# Patient Record
Sex: Female | Born: 1960 | Race: White | Hispanic: No | Marital: Single | Smoking: Former smoker
Health system: Southern US, Community
[De-identification: ages and names within clinical notes are randomized; demographics above are authoritative.]

## PROBLEM LIST (undated history)

## (undated) DIAGNOSIS — F329 Major depressive disorder, single episode, unspecified: Secondary | ICD-10-CM

## (undated) DIAGNOSIS — F1027 Alcohol dependence with alcohol-induced persisting dementia: Secondary | ICD-10-CM

## (undated) DIAGNOSIS — K529 Noninfective gastroenteritis and colitis, unspecified: Secondary | ICD-10-CM

## (undated) DIAGNOSIS — F419 Anxiety disorder, unspecified: Secondary | ICD-10-CM

## (undated) DIAGNOSIS — F32A Depression, unspecified: Secondary | ICD-10-CM

## (undated) DIAGNOSIS — F1096 Alcohol use, unspecified with alcohol-induced persisting amnestic disorder: Secondary | ICD-10-CM

## (undated) DIAGNOSIS — E781 Pure hyperglyceridemia: Secondary | ICD-10-CM

## (undated) HISTORY — PX: BREAST ENHANCEMENT SURGERY: SHX7

## (undated) HISTORY — DX: Noninfective gastroenteritis and colitis, unspecified: K52.9

## (undated) HISTORY — DX: Anxiety disorder, unspecified: F41.9

## (undated) HISTORY — DX: Pure hyperglyceridemia: E78.1

## (undated) HISTORY — DX: Depression, unspecified: F32.A

## (undated) HISTORY — DX: Alcohol dependence with alcohol-induced persisting dementia: F10.27

## (undated) HISTORY — DX: Major depressive disorder, single episode, unspecified: F32.9

## (undated) HISTORY — DX: Alcohol use, unspecified with alcohol-induced persisting amnestic disorder: F10.96

---

## 1994-04-27 HISTORY — PX: BACK SURGERY: SHX140

## 1998-12-18 ENCOUNTER — Other Ambulatory Visit: Admission: RE | Admit: 1998-12-18 | Discharge: 1998-12-18 | Payer: Self-pay | Admitting: Obstetrics and Gynecology

## 2000-05-10 ENCOUNTER — Emergency Department (HOSPITAL_COMMUNITY): Admission: EM | Admit: 2000-05-10 | Discharge: 2000-05-11 | Payer: Self-pay | Admitting: Emergency Medicine

## 2000-05-11 ENCOUNTER — Encounter: Payer: Self-pay | Admitting: Emergency Medicine

## 2000-05-26 ENCOUNTER — Encounter: Payer: Self-pay | Admitting: Neurosurgery

## 2000-05-26 ENCOUNTER — Ambulatory Visit (HOSPITAL_COMMUNITY): Admission: RE | Admit: 2000-05-26 | Discharge: 2000-05-26 | Payer: Self-pay | Admitting: Neurosurgery

## 2000-07-22 ENCOUNTER — Encounter: Payer: Self-pay | Admitting: Neurosurgery

## 2000-07-22 ENCOUNTER — Ambulatory Visit (HOSPITAL_COMMUNITY): Admission: RE | Admit: 2000-07-22 | Discharge: 2000-07-22 | Payer: Self-pay | Admitting: Neurosurgery

## 2008-03-01 ENCOUNTER — Other Ambulatory Visit: Admission: RE | Admit: 2008-03-01 | Discharge: 2008-03-01 | Payer: Self-pay | Admitting: Internal Medicine

## 2013-02-13 ENCOUNTER — Other Ambulatory Visit: Payer: Self-pay

## 2013-02-13 DIAGNOSIS — Z9882 Breast implant status: Secondary | ICD-10-CM

## 2013-02-13 DIAGNOSIS — Z1231 Encounter for screening mammogram for malignant neoplasm of breast: Secondary | ICD-10-CM

## 2013-02-15 ENCOUNTER — Ambulatory Visit
Admission: RE | Admit: 2013-02-15 | Discharge: 2013-02-15 | Disposition: A | Discharge status: Home / Self Care | Payer: Self-pay | Source: Ambulatory Visit

## 2013-02-15 DIAGNOSIS — Z1231 Encounter for screening mammogram for malignant neoplasm of breast: Secondary | ICD-10-CM

## 2013-02-15 DIAGNOSIS — Z9882 Breast implant status: Secondary | ICD-10-CM

## 2013-02-20 ENCOUNTER — Other Ambulatory Visit: Payer: Self-pay | Admitting: Internal Medicine

## 2013-02-20 DIAGNOSIS — R928 Other abnormal and inconclusive findings on diagnostic imaging of breast: Secondary | ICD-10-CM

## 2013-03-09 ENCOUNTER — Ambulatory Visit
Admission: RE | Admit: 2013-03-09 | Discharge: 2013-03-09 | Disposition: A | Discharge status: Home / Self Care | Payer: BC Managed Care – PPO | Source: Ambulatory Visit | Attending: Internal Medicine | Admitting: Internal Medicine

## 2013-03-09 DIAGNOSIS — R928 Other abnormal and inconclusive findings on diagnostic imaging of breast: Secondary | ICD-10-CM

## 2014-07-25 ENCOUNTER — Encounter: Payer: Self-pay | Admitting: Neurology

## 2014-07-25 ENCOUNTER — Ambulatory Visit (INDEPENDENT_AMBULATORY_CARE_PROVIDER_SITE_OTHER): Payer: BLUE CROSS/BLUE SHIELD | Admitting: Neurology

## 2014-07-25 ENCOUNTER — Encounter: Payer: Self-pay | Admitting: *Deleted

## 2014-07-25 VITALS — BP 142/92 | HR 82 | Resp 14 | Ht 68.0 in | Wt 149.0 lb

## 2014-07-25 DIAGNOSIS — F1096 Alcohol use, unspecified with alcohol-induced persisting amnestic disorder: Secondary | ICD-10-CM

## 2014-07-25 DIAGNOSIS — F1027 Alcohol dependence with alcohol-induced persisting dementia: Secondary | ICD-10-CM | POA: Diagnosis not present

## 2014-07-25 DIAGNOSIS — R55 Syncope and collapse: Secondary | ICD-10-CM

## 2014-07-25 HISTORY — DX: Alcohol use, unspecified with alcohol-induced persisting amnestic disorder: F10.96

## 2014-07-25 HISTORY — DX: Alcohol dependence with alcohol-induced persisting dementia: F10.27

## 2014-07-25 MED ORDER — NAT-RUL PRENATAL VITAMINS 28-0.8 MG PO TABS: ORAL_TABLET | ORAL | Status: DC

## 2014-07-25 NOTE — Progress Notes (Signed)
Provider:  Melvyn Novas, M D  Referring Provider: Thayer Headings, MD Primary Care Physician:  Thayer Headings, MD  Chief Complaint  Patient presents with  . NP Sherry Scott  memory loss    Rm 10, daughter    HPI:  Sherry Scott is a 54 y.o. caucasian , rigt handed female,  seen here as a referral  from Dr. Thea Scott for memory loss,  Sherry Scott is referred today for evaluation of memory loss or dementia by Dr. Ferd Hibbs, her primary care physician the patient has been endorsing a past medical history of depression and anxiety and she had a back surgery in 1996, she is a current smoker endorsed some blurred vision memory loss and confusional spells as well as still some anxiety in her review of systems. She stated that the prescribed alprazolam is not what she likes to use- and that it doesn't make her feel very good. She is on an SSRI, which is citalopram 10 mg daily. The patient believes that she has more seasonal depression and that winter is a gloomy time for her. About one year ago, in March 2015 ,the patient first noticed memory difficulties. This alone also she is accompanied today by her daughter. Dr. Ronne Binning brought on 07-03-14 that the patient recently got lost in her own neighborhood, a fact she has already forgotten. She lives 25 years in the same area. She used to be a heavy drinker, still smokes. She forgot meals. She has good and bad days, she is remarkably unconcerned and her daughter had to call the maternal aunt to get someone to confront and change her mother. She has been sober for only 3 weeks.  She has difficulties for over 2-3 years according to the daughter.  Her mother lives with 4 dogs.   [name], Sherry Scott, came to assist in the task about 3 weeks ago. The patient had difficulties with paying the bills, and she had also not filed her taxes, not paid bills  and she had not renewed tags on her car, expired last January. The patient is at this time not  gainfully employed, she does keep her own house and she also long etc. she takes care of her 4 dogs, at this time unassisted. Her daughter feels the house is not kept clean.  The patient has all her daily routines cycle around the need of the animals. She has not worked out side the home. She retired from her own Furniture conservator/restorer business.   The patient has begun to take B complex vitamins. I reviewed today's Mini-Mental Status Examination with Sherry Scott, a 21 out of 30 points. The main point loss was in the 3 words of recall, but in the visual spatial orientation she was not able to draw clock face today. She was able to copy an image of 5 pentagons overlapping, and she generated 8 animal names. She mistook the months of the year for April, stated that it approached summer but could not find the word for spring, and was not sure about the date of course. Attention and calculation were attempted but could not be performed and she scored only 2 out of 5 points in spelling backwards.            Review of Systems: Out of a complete 14 system review, the patient complains of only the following symptoms, and all other reviewed systems are negative.  patient appears aloof.   History   Social History  . Marital Status:  Single    Spouse Name: N/A  . Number of Children: 1  . Years of Education: 2y coll   Occupational History  . retired    Social History Main Topics  . Smoking status: Current Every Day Smoker  . Smokeless tobacco: Not on file  . Alcohol Use: No     Comment: 3wks sober   . Drug Use: No  . Sexual Activity: Not on file   Other Topics Concern  . Not on file   Social History Narrative   Caffeine 5 cups coffee daily (weak).    Family History  Problem Relation Age of Onset  . Lung cancer Father   . Breast cancer Mother     Past Medical History  Diagnosis Date  . High triglycerides   . Anxiety   . Depression     Past Surgical History  Procedure Laterality Date   . Back surgery  1996    herniated disc Lower back    Current Outpatient Prescriptions  Medication Sig Dispense Refill  . citalopram (CELEXA) 10 MG tablet Take 10 mg by mouth daily.  1  . LORazepam (ATIVAN) 0.5 MG tablet Take 0.5 mg by mouth daily as needed for anxiety.    . Multiple Vitamin (MULTIVITAMIN) tablet Take 1 tablet by mouth daily.     No current facility-administered medications for this visit.    Allergies as of 07/25/2014  . (No Known Allergies)    Vitals: BP 142/92 mmHg  Pulse 82  Resp 14  Ht  (1.727 m)  Wt 149 lb (67.586 kg)  BMI 22.66 kg/m2 Last Weight:  Wt Readings from Last 1 Encounters:  07/25/14 149 lb (67.586 kg)   Last Height:   Ht Readings from Last 1 Encounters:  07/25/14  (1.727 m)    Physical exam:  General: The patient is awake, alert and appears not in acute distress. Head: Normocephalic, atraumatic. Neck is supple. Mallampati 2 , neck circumference: 14. Cardiovascular:  Regular rate and rhythm *, without  murmurs or carotid bruit, and without distended neck veins. Respiratory: Lungs are clear to auscultation. Skin:  Spider naevi, has reddened palms.  Trunk: BMI is normal posture.  Neurologic exam : The patient is awake and alert, oriented to place and time.  Memory subjective  described as impaired.  There is a normal attention span & concentration ability. Speech is non fluent without dysarthria, dysphonia but she has interrupted her sentences , has  aphasia. She also misunderstands, mistakes the console of the car for the space under th seat. Mood and affect are appropriate.  Cranial nerves: Pupils are equal and briskly reactive to light. Funduscopic exam without evidence of pallor or edema.  Extraocular movements  in vertical and horizontal planes intact and without nystagmus.  Visual fields by finger perimetry are intact. Hearing to finger rub intact.  Facial sensation intact to fine touch. Facial motor strength is  symmetric and tongue and uvula move midline. Tongue protrusion into either cheek is normal. Shoulder shrug is normal.   Motor exam:    Normal tone ,muscle bulk and symmetric  strength in all extremities.  Sensory:  Fine touch, pinprick and vibration were tested in all extremities. Proprioception was normal.  Coordination: Rapid alternating movements in the fingers/hands were normal. Finger-to-nose maneuver normal without evidence of ataxia, dysmetria or tremor.  Gait and station: Patient walks without assistive device and is able unassisted to climb up to the exam table. Strength within normal limits. Stance is stable  and normal. Tandem gait is unfragmented. Romberg testing is  negative   Deep tendon reflexes: in the  upper and lower extremities are brisk,  symmetric and intact. Babinski maneuver downgoing.   Assessment:  After physical and neurologic examination, review of laboratory studies, imaging, neurophysiology testing and pre-existing records,  45 minute assessment is that of : more than 30 minutes of face to face time was used to explain different forms of  dementia, the possible reversibility of dementia. The treatment options.  The patient consumed a 6 pack per day or more, denied heavy use of liquor or wine.      Patient with alcohol colleague, word finding difficulties, but no evidence of abnormal eye movements. At this time the patient has significant memory deficits but they could be considered reversible. I will obtain an MRI of the brain with and without contrast to look especially for a Wernicke's Korsakoff pathology.   The patient is already taking a B vitamin what I would like for her to do is to have a meal plan that she adheres to each day that should be high and proteins and vitamins, and also in electrolytes. Instead of the B complex I usually ask patients to take a prenatal vitamin since it also contains Foley acid and some iron 3 times a day. She may want to include  more food and solids in her daily diet.   she has been sober for 3 weeks- angry at her daughter and sister, easily agitated. Celexa should be 20 mg.   Plan:  Treatment plan and additional workup : Rv in 6 weeks with NP or me.  Continue  sessions with your therapist / substance abuse counselor-  Begun 3 weeks ago  with Sherry Scott , @ [facility].     Sherry Mylararmen Lory Nowaczyk MD 07/25/2014

## 2014-07-31 ENCOUNTER — Ambulatory Visit (INDEPENDENT_AMBULATORY_CARE_PROVIDER_SITE_OTHER): Payer: BLUE CROSS/BLUE SHIELD | Admitting: Neurology

## 2014-07-31 DIAGNOSIS — R55 Syncope and collapse: Secondary | ICD-10-CM | POA: Diagnosis not present

## 2014-07-31 DIAGNOSIS — F1027 Alcohol dependence with alcohol-induced persisting dementia: Secondary | ICD-10-CM

## 2014-07-31 DIAGNOSIS — F1096 Alcohol use, unspecified with alcohol-induced persisting amnestic disorder: Secondary | ICD-10-CM

## 2014-08-01 ENCOUNTER — Ambulatory Visit (INDEPENDENT_AMBULATORY_CARE_PROVIDER_SITE_OTHER): Payer: BLUE CROSS/BLUE SHIELD

## 2014-08-01 DIAGNOSIS — F1096 Alcohol use, unspecified with alcohol-induced persisting amnestic disorder: Secondary | ICD-10-CM | POA: Diagnosis not present

## 2014-08-01 DIAGNOSIS — F1027 Alcohol dependence with alcohol-induced persisting dementia: Secondary | ICD-10-CM | POA: Diagnosis not present

## 2014-08-01 MED ORDER — GADOPENTETATE DIMEGLUMINE 469.01 MG/ML IV SOLN: 14.0000 mL | Freq: Once | INTRAVENOUS | Status: AC | PRN

## 2014-08-03 ENCOUNTER — Telehealth: Payer: Self-pay | Admitting: Neurology

## 2014-08-03 NOTE — Telephone Encounter (Signed)
  Called the patient. MRI the brain shows mild white matter changes. No acute changes are seen. Nonspecific findings.  MRI brain 08/03/14:  IMPRESSION:  Mildly abnormal MRI brain (with and without) demonstrating: 1. Mild diffuse and moderate perisylvian atrophy.  2. Mild scattered periventricular and subcortical foci of non-specific gliosis. These findings are non-specific and considerations include autoimmune, inflammatory, post-infectious, microvascular ischemic or migraine associated etiologies.  3. No acute findings.

## 2014-08-03 NOTE — Telephone Encounter (Signed)
I called the patient. EEG shows mild background slowing. May be related to a mild toxic or metabolic encephalopathy, or any process that results in a dementing illness. No epileptiform discharges were seen.

## 2014-08-03 NOTE — Procedures (Signed)
   [facility]  EEG (ELECTROENCEPHALOGRAM) REPORT   STUDY DATE: 07/31/14 PATIENT NAME: Sherry Scott DOB: 07-15-1960 MRN: [id number]  ORDERING CLINICIAN: Melvyn Novasarmen Dohmeier, MD   TECHNOLOGIST: Gearldine ShownLorraine Jones  TECHNIQUE: Electroencephalogram was recorded utilizing standard 10-20 system of lead placement and reformatted into average and bipolar montages.  RECORDING TIME: 34 minutes  ACTIVATION: hyperventilation and photic stimulation  CLINICAL INFORMATION: 54 year old female with memory loss.  FINDINGS: Background rhythms of mixed frequencies (6-7 hertz, 7-8 hertz, 8-9 hertz) and 20-30 microvolts. Intermixed beta waves noted. Intermixed muscle artifact noted. No focal, lateralizing, epileptiform activity or seizures are seen. Patient recorded in the awake and drowsy state.    IMPRESSION:  Equivocal EEG in the awake and drowsy states demonstrating; 1. Mixed frequencies in the alpha, beta and theta ranges. May be due to toxic, metabolic and medication side effect etiologies. 2. No focal, lateralizing, epileptiform activity or seizures are seen.     INTERPRETING PHYSICIAN:  Suanne MarkerVIKRAM R. PENUMALLI, MD Certified in Neurology, Neurophysiology and Neuroimaging  [facility] [address], [address] [place], [place] [address] [phone]

## 2014-08-06 MED ORDER — DONEPEZIL HCL 5 MG PO TABS: 5.0000 mg | ORAL_TABLET | Freq: Every day | ORAL | Status: DC

## 2014-08-06 NOTE — Addendum Note (Signed)
Addended by: Stephanie AcreWILLIS, [place] Pinzon on: 08/06/2014 10:08 AM   Modules accepted: Orders

## 2014-08-06 NOTE — Telephone Encounter (Signed)
I called the daughter. I went over the results of the MRI. It shows mild small vessel disease. The patient has a history of alcohol abuse, she has been off of alcohol for about one month. She is taking B complex vitamins. She is eating and drinking well. I will initiate low-dose Aricept at this time.

## 2014-08-06 NOTE — Telephone Encounter (Signed)
Daughter Murlean IbaKatie Weekly @ [phone], requesting a return call to discuss MRI results further.  Please call and advise.

## 2014-08-13 ENCOUNTER — Telehealth: Payer: Self-pay | Admitting: Neurology

## 2014-08-13 MED ORDER — CITALOPRAM HYDROBROMIDE 20 MG PO TABS: 20.0000 mg | ORAL_TABLET | Freq: Every day | ORAL | Status: DC

## 2014-08-13 NOTE — Telephone Encounter (Signed)
Patient's daughter requesting refill increase to 20 mg for Rx citalopram (CELEXA) 10 MG tablet as discussed with Dr. Vickey Hugerohmeier.  Please call and advise.

## 2014-08-13 NOTE — Telephone Encounter (Signed)
Last OV note says: she has been sober for 3 weeks- angry at her daughter and sister, easily agitated. Celexa should be 20 mg.  RX updated.  I called back.  They are aware.

## 2014-09-07 ENCOUNTER — Ambulatory Visit (INDEPENDENT_AMBULATORY_CARE_PROVIDER_SITE_OTHER): Payer: BLUE CROSS/BLUE SHIELD | Admitting: Nurse Practitioner

## 2014-09-07 ENCOUNTER — Encounter: Payer: Self-pay | Admitting: Nurse Practitioner

## 2014-09-07 VITALS — BP 113/80 | HR 78 | Ht 68.0 in | Wt 152.4 lb

## 2014-09-07 DIAGNOSIS — F1096 Alcohol use, unspecified with alcohol-induced persisting amnestic disorder: Secondary | ICD-10-CM

## 2014-09-07 DIAGNOSIS — F1027 Alcohol dependence with alcohol-induced persisting dementia: Secondary | ICD-10-CM

## 2014-09-07 MED ORDER — DONEPEZIL HCL 10 MG PO TABS
10.0000 mg | ORAL_TABLET | Freq: Every day | ORAL | Status: DC
Start: 2014-09-07 — End: 2015-02-13

## 2014-09-07 NOTE — Progress Notes (Signed)
[facility]  PATIENT: Sherry ReefHelen D Scott DOB: Oct 10, 1960   REASON FOR VISIT: Follow-up for memory loss associated with chronic alcohol use  HISTORY FROM: Patient and daughter    HISTORY OF PRESENT ILLNESS: Ms. Sherry Scott, 54 year old female returns for follow-up. She was initially evaluated by Dr. Vickey Hugerohmeier 07/25/2014. She has a history of dementia most likely related to her alcohol use. She is now sober and seeing a substance abuse counselor. EEG with mild background slowing no evidence of seizure activity. MRI shows small vessel disease. She was placed on low dose Aricept by Dr. Anne HahnWillis in Dr. Oliva Bustardohmeier's absence. She feels this has helped and she denies any side effects to the medication. She is also on Celexa 20 mg daily and B complex prenatal vitamins. Appetite is good and she is sleeping well. She returns for reevaluation  HISTORY: Sherry Scott is a 54 y.o. caucasian , rigt handed female, seen here as a referral from Dr. Thea SilversmithMackenzie for memory loss,  Sherry Scott is referred today for evaluation of memory loss or dementia by Dr. Ferd HibbsMcKinsey, her primary care physician the patient has been endorsing a past medical history of depression and anxiety and she had a back surgery in 1996, she is a current smoker endorsed some blurred vision memory loss and confusional spells as well as still some anxiety in her review of systems. She stated that the prescribed alprazolam is not what she likes to use- and that it doesn't make her feel very good. She is on an SSRI, which is citalopram 10 mg daily. The patient believes that she has more seasonal depression and that winter is a gloomy time for her. About one year ago, in March 2015 ,the patient first noticed memory difficulties. This alone also she is accompanied today by her daughter. Dr. Ronne BinningMcKenzie brought on 07-03-14 that the patient recently got lost in her own neighborhood, a fact she has already forgotten. She lives 25 years in the same  area. She used to be a heavy drinker, still smokes. She forgot meals. She has good and bad days, she is remarkably unconcerned and her daughter had to call the maternal aunt to get someone to confront and change her mother. She has been sober for only 3 weeks. She has difficulties for over 2-3 years according to the daughter. Her mother lives with 4 dogs.   [name], Sherry Scott, came to assist in the task about 3 weeks ago. The patient had difficulties with paying the bills, and she had also not filed her taxes, not paid bills and she had not renewed tags on her car, expired last January. The patient is at this time not gainfully employed, she does keep her own house and she also long etc. she takes care of her 4 dogs, at this time unassisted. Her daughter feels the house is not kept clean.  The patient has all her daily routines cycle around the need of the animals. She has not worked out side the home. 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   The patient has begun to take B complex vitamins. I reviewed today's Mini-Mental Status Examination with Sherry Scott, a 21 out of 30 points. The main point loss was in the 3 words of recall, but in the visual spatial orientation she was not able to draw clock face today. She was able to copy an image of 5 pentagons overlapping, and she generated 8 animal names. She mistook the months of the year for April, stated  that it approached summer but could not find the word for spring, and was not sure about the date of course. Attention and calculation were attempted but could not be performed and she scored only 2 out of 5 points in spelling backwards.   REVIEW OF SYSTEMS: Full 14 system review of systems performed and notable only for those listed, all others are neg:  Constitutional: Appetite change  Cardiovascular: neg Ear/Nose/Throat: neg  Skin: neg Eyes: neg Respiratory: neg Gastroitestinal: neg  Hematology/Lymphatic: Easy bruising    Endocrine: neg Musculoskeletal:neg Allergy/Immunology: neg Neurological: Memory loss, speech difficulty Psychiatric: Agitation, confusion Sleep : neg   ALLERGIES: No Known Allergies  HOME MEDICATIONS: Outpatient Prescriptions Prior to Visit  Medication Sig Dispense Refill  . citalopram (CELEXA) 20 MG tablet Take 1 tablet (20 mg total) by mouth daily. 90 tablet 1  . donepezil (ARICEPT) 5 MG tablet Take 1 tablet (5 mg total) by mouth at bedtime. 30 tablet 1  . LORazepam (ATIVAN) 0.5 MG tablet Take 0.5 mg by mouth daily as needed for anxiety.    . Prenatal Vit-Fe Fumarate-FA (NAT-RUL PRENATAL VITAMINS) 28-0.8 MG TABS 1 with each meal. 90 tablet 11  . Multiple Vitamin (MULTIVITAMIN) tablet Take 1 tablet by mouth daily.     No facility-administered medications prior to visit.    PAST MEDICAL HISTORY: Past Medical History  Diagnosis Date  . High triglycerides   . Anxiety   . Depression   . 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 Back surgery  1996    herniated disc Lower back    FAMILY HISTORY: Family History  Problem Relation Age of Onset  . Lung cancer Father   . Breast cancer Mother     SOCIAL HISTORY: History   Social History  . Marital Status: Single    Spouse Name: N/A  . Number of Children: 1  . Years of Education: 2y coll   Occupational History  . retired    Social History Main Topics  . Smoking status: Current Every Day Smoker  . Smokeless tobacco: Not on file  . Alcohol Use: No     Comment: 3wks sober   . Drug Use: No  . Sexual Activity: Not on file   Other Topics Concern  . Not on file   Social History Narrative   Caffeine 5 cups coffee daily (weak).     PHYSICAL EXAM  Filed Vitals:   09/07/14 1004  BP: 113/80  Pulse: 78  Height: 5\' 8"  (1.727 m)  Weight: 152 lb 6.4 oz (69.128 kg)   Body mass  index is 23.18 kg/(m^2). General: The patient is awake, alert and appears not in acute distress.  Head: Normocephalic, atraumatic.  Neck is supple. . Cardiovascular: Regular rate and rhythm , without murmurs Respiratory: Lungs are clear to auscultation. Skin: Spider naevi,   Neurologic exam : The patient is awake and alert, oriented to place and time. Memory subjective described as impaired.MMSE 22/30. Mood and affect are appropriate.  Cranial nerves:Pupils are equal and briskly reactive to light. Funduscopic exam without evidence of pallor or edema. Extraocular movements in vertical and horizontal planes intact and without nystagmus. Visual fields by finger perimetry are intact.Hearing to finger rub intact. Facial sensation intact to fine touch. Facial motor strength is symmetric and tongue and uvula move midline. Tongue protrusion into either cheek is normal. Shoulder shrug is normal.  Motor  exam: Normal tone ,muscle bulk and symmetric strength in all extremities. Sensory: Fine touch, pinprick and vibration were tested in all extremities. Proprioception was normal. Coordination: Rapid alternating movements in the fingers/hands were normal. Finger-to-nose maneuver normal without evidence of ataxia, dysmetria or tremor. Gait and station: Patient walks without assistive device and is able unassisted to climb up to the exam table. Strength within normal limits. Stance is stable and normal. Tandem gait is unfragmented. Romberg testing is negative  Deep tendon reflexes: in the upper and lower extremities are brisk, symmetric and intact. Babinski maneuver downgoing.    DIAGNOSTIC DATA (LABS, IMAGING, TESTING) - ASSESSMENT AND PLAN  54 y.o. 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 here to follow-up. MRI of the brain with small  vessel disease. EEG is mild background slowing may be due to toxic or metabolic encephalopathy.  Increase Aricept to 10 mg daily will call in Rx Continue prenatal complex vitamins Continue Celexa at current dose I spent additional 15 minutes in total face to face time with the patient more than 50% of which was spent counseling and coordination of care, reviewing test results reviewing medications and discussing and reviewing the diagnosis of dementia related to alcoholism and further treatment options.  Patient was advised not to drive at this time Follow-up in 6 months Vst time 30 min Nilda Riggs, [facility], [facility], APRN  [facility] [address] [place], [place] [address] [phone]

## 2014-09-07 NOTE — Patient Instructions (Signed)
Increase Aricept to 10 mg daily will call in Rx Continue prenatal complex vitamins Continue Celexa at current dose Follow-up in 6 months

## 2014-09-10 NOTE — Progress Notes (Signed)
I agree with the assessment and plan as directed by NP .The patient is known to me .   Valma Rotenberg, MD  

## 2014-09-12 ENCOUNTER — Telehealth: Payer: Self-pay

## 2014-09-12 NOTE — Telephone Encounter (Signed)
Called pt to give results of MRI in April, pt states that she has already heard the results and doesn't need them again.

## 2014-09-28 ENCOUNTER — Other Ambulatory Visit: Payer: Self-pay | Admitting: Neurology

## 2014-10-27 ENCOUNTER — Other Ambulatory Visit: Payer: Self-pay | Admitting: Neurology

## 2014-12-17 ENCOUNTER — Encounter (HOSPITAL_COMMUNITY): Payer: Self-pay | Admitting: *Deleted

## 2014-12-17 ENCOUNTER — Emergency Department (HOSPITAL_COMMUNITY)
Admission: EM | Admit: 2014-12-17 | Discharge: 2014-12-17 | Disposition: A | Discharge status: Home / Self Care | Payer: BLUE CROSS/BLUE SHIELD | Attending: Physician Assistant | Admitting: Physician Assistant

## 2014-12-17 DIAGNOSIS — F329 Major depressive disorder, single episode, unspecified: Secondary | ICD-10-CM | POA: Insufficient documentation

## 2014-12-17 DIAGNOSIS — F419 Anxiety disorder, unspecified: Secondary | ICD-10-CM | POA: Insufficient documentation

## 2014-12-17 DIAGNOSIS — Z8639 Personal history of other endocrine, nutritional and metabolic disease: Secondary | ICD-10-CM | POA: Diagnosis not present

## 2014-12-17 DIAGNOSIS — D696 Thrombocytopenia, unspecified: Secondary | ICD-10-CM | POA: Diagnosis not present

## 2014-12-17 DIAGNOSIS — Y998 Other external cause status: Secondary | ICD-10-CM | POA: Diagnosis not present

## 2014-12-17 DIAGNOSIS — Z720 Tobacco use: Secondary | ICD-10-CM | POA: Diagnosis not present

## 2014-12-17 DIAGNOSIS — S90861A Insect bite (nonvenomous), right foot, initial encounter: Secondary | ICD-10-CM | POA: Insufficient documentation

## 2014-12-17 DIAGNOSIS — S90562A Insect bite (nonvenomous), left ankle, initial encounter: Secondary | ICD-10-CM | POA: Diagnosis not present

## 2014-12-17 DIAGNOSIS — Y9289 Other specified places as the place of occurrence of the external cause: Secondary | ICD-10-CM | POA: Insufficient documentation

## 2014-12-17 DIAGNOSIS — Y9389 Activity, other specified: Secondary | ICD-10-CM | POA: Diagnosis not present

## 2014-12-17 DIAGNOSIS — S90862A Insect bite (nonvenomous), left foot, initial encounter: Secondary | ICD-10-CM | POA: Diagnosis not present

## 2014-12-17 DIAGNOSIS — W57XXXA Bitten or stung by nonvenomous insect and other nonvenomous arthropods, initial encounter: Secondary | ICD-10-CM | POA: Insufficient documentation

## 2014-12-17 DIAGNOSIS — F0390 Unspecified dementia without behavioral disturbance: Secondary | ICD-10-CM | POA: Insufficient documentation

## 2014-12-17 DIAGNOSIS — Z79899 Other long term (current) drug therapy: Secondary | ICD-10-CM | POA: Diagnosis not present

## 2014-12-17 DIAGNOSIS — F1097 Alcohol use, unspecified with alcohol-induced persisting dementia: Secondary | ICD-10-CM

## 2014-12-17 DIAGNOSIS — S90561A Insect bite (nonvenomous), right ankle, initial encounter: Secondary | ICD-10-CM | POA: Diagnosis not present

## 2014-12-17 DIAGNOSIS — R7989 Other specified abnormal findings of blood chemistry: Secondary | ICD-10-CM | POA: Diagnosis present

## 2014-12-17 LAB — CBC WITH DIFFERENTIAL/PLATELET
BASOS ABS: 0 10*3/uL (ref 0.0–0.1)
BASOS PCT: 0 % (ref 0–1)
Eosinophils Absolute: 0.3 10*3/uL (ref 0.0–0.7)
Eosinophils Relative: 4 % (ref 0–5)
HEMATOCRIT: 42.8 % (ref 36.0–46.0)
HEMOGLOBIN: 14.4 g/dL (ref 12.0–15.0)
LYMPHS PCT: 44 % (ref 12–46)
Lymphs Abs: 3.7 10*3/uL (ref 0.7–4.0)
MCH: 32 pg (ref 26.0–34.0)
MCHC: 33.6 g/dL (ref 30.0–36.0)
MCV: 95.1 fL (ref 78.0–100.0)
MONO ABS: 0.4 10*3/uL (ref 0.1–1.0)
Monocytes Relative: 5 % (ref 3–12)
NEUTROS ABS: 4 10*3/uL (ref 1.7–7.7)
NEUTROS PCT: 48 % (ref 43–77)
Platelets: 113 10*3/uL — ABNORMAL LOW (ref 150–400)
RBC: 4.5 MIL/uL (ref 3.87–5.11)
RDW: 13.2 % (ref 11.5–15.5)
WBC: 8.4 10*3/uL (ref 4.0–10.5)

## 2014-12-17 LAB — COMPREHENSIVE METABOLIC PANEL
ALBUMIN: 4.4 g/dL (ref 3.5–5.0)
ALK PHOS: 67 U/L (ref 38–126)
ALT: 21 U/L (ref 14–54)
AST: 22 U/L (ref 15–41)
Anion gap: 9 (ref 5–15)
BILIRUBIN TOTAL: 1.2 mg/dL (ref 0.3–1.2)
BUN: 12 mg/dL (ref 6–20)
CO2: 30 mmol/L (ref 22–32)
CREATININE: 0.95 mg/dL (ref 0.44–1.00)
Calcium: 9.6 mg/dL (ref 8.9–10.3)
Chloride: 105 mmol/L (ref 101–111)
GFR calc Af Amer: >60 mL/min (ref >60)
GLUCOSE: 120 mg/dL — AB (ref 65–99)
Potassium: 3.9 mmol/L (ref 3.5–5.1)
Sodium: 144 mmol/L (ref 135–145)
TOTAL PROTEIN: 7.4 g/dL (ref 6.5–8.1)

## 2014-12-17 LAB — DIC (DISSEMINATED INTRAVASCULAR COAGULATION) PANEL
APTT: 36 s (ref 24–37)
PLATELETS: 114 10*3/uL — AB (ref 150–400)
SMEAR REVIEW: NONE SEEN

## 2014-12-17 LAB — DIC (DISSEMINATED INTRAVASCULAR COAGULATION)PANEL
D-Dimer, Quant: 0.46 ug/mL-FEU (ref 0.00–0.48)
Fibrinogen: 296 mg/dL (ref 204–475)
INR: 1.05 (ref 0.00–1.49)
Prothrombin Time: 13.9 seconds (ref 11.6–15.2)

## 2014-12-17 LAB — RETICULOCYTES
RBC.: 4.5 MIL/uL (ref 3.87–5.11)
RETIC COUNT ABSOLUTE: 40.5 10*3/uL (ref 19.0–186.0)
Retic Ct Pct: 0.9 % (ref 0.4–3.1)

## 2014-12-17 LAB — LACTATE DEHYDROGENASE: LDH: 135 U/L (ref 98–192)

## 2014-12-17 NOTE — Consult Note (Addendum)
[place]  [phone] [phone]  Inpatient New Consult Note   Patient Care Team: [name], MD as PCP - General (Internal Medicine) [name], MD (Internal Medicine) 12/17/2014  CHIEF COMPLAINTS/PURPOSE OF CONSULTATION:  Thrombocytopenia  Referring physician: [name], MD  HISTORY OF PRESENTING ILLNESS:  Sherry Scott 54 y.o. female with past medical history of heavy alcohol drinker, alcohol-related dementia, on Aricept, depression, anxiety, is being referred by her PCP today for newly onset severe thrombocytopenia. I'm seeing her in [place] emergency room.  According to the lab results from Dr. Zackery Scott office, she has had mild some cytopenia with platelet count 119K in 11/2012, the rest of CBC was normal. Repeated CBC showed a platelet count 128K in September 2015. She had a routine checkup with her primary care physician Dr. Noah Scott on 12/03/2014 and CBC showed plt 17K, repeated on 12/14/2014 and plt was 14K. W BC with differential, and hemoglobin were normal. CMP from 12/03/2014 were normal. She has no clinical signs of bleeding,  including hematochezia, melana, hemoptysis, hematuria or epitaxis. No mucosal bleeding or easy bruising. Dr. Noah Scott contacted me this morning, and I recommend her to come to [facility] emergency room. She is accompanied by her daughter.   She feels well in general, denies any significant pain, no recent episodes of fever, chill, diarrhea, or other illness. She denies any new prescription or over-the-counter medication. She has been having some skin rash on the bottom of her feet for the past year, and have some insect bite around her ankles lately, no other skin rashes. She lives alone, able to function at home. Her daughter checks on her frequently at home.  MEDICAL HISTORY:  Past Medical History  Diagnosis Date  . High triglycerides   . Anxiety   . Depression   . Alcoholism associated  with dementia 07/25/2014  . Wernicke-Korsakoff syndrome (alcoholic) 6/37/8588    SURGICAL HISTORY: Past Surgical History  Procedure Laterality Date  . Back surgery  1996    herniated disc Lower back    SOCIAL HISTORY: Social History   Social History  . Marital Status: Single    Spouse Name: N/A  . Number of Children: 1  . Years of Education: 2y coll   Occupational History  . retired    Social History Main Topics  . Smoking status: Current Every Day Smoker  . Smokeless tobacco: Not on file  . Alcohol Use: No     Comment: 3wks sober   . Drug Use: No  . Sexual Activity: Not on file   Other Topics Concern  . Not on file   Social History Narrative   Caffeine 5 cups coffee daily (weak).    FAMILY HISTORY: Family History  Problem Relation Age of Onset  . Lung cancer Father   . Breast cancer Mother     ALLERGIES:  has No Known Allergies.  MEDICATIONS:  No current facility-administered medications for this encounter.   Current Outpatient Prescriptions  Medication Sig Dispense Refill  . citalopram (CELEXA) 20 MG tablet Take 1 tablet (20 mg total) by mouth daily. 90 tablet 1  . donepezil (ARICEPT) 10 MG tablet Take 1 tablet (10 mg total) by mouth daily. 30 tablet 6  . LORazepam (ATIVAN) 0.5 MG tablet Take 0.5 mg by mouth daily as needed for anxiety.    . Omega-3 Fatty Acids (FISH OIL PO) Take 2 capsules by mouth at bedtime.    . Prenatal Vit-Fe Fumarate-FA (NAT-RUL PRENATAL VITAMINS) 28-0.8 MG  TABS 1 with each meal. (Patient taking differently: Take 1 tablet by mouth 2 (two) times daily. 1 with each meal.) 90 tablet 11  . TURMERIC PO Take 1 capsule by mouth at bedtime.      REVIEW OF SYSTEMS:   Constitutional: Denies fevers, chills or abnormal night sweats Eyes: Denies blurriness of vision, double vision or watery eyes Ears, nose, mouth, throat, and face: Denies mucositis or sore throat Respiratory: Denies cough, dyspnea or wheezes Cardiovascular: Denies  palpitation, chest discomfort or lower extremity swelling Gastrointestinal:  Denies nausea, heartburn or change in bowel habits Skin: Denies abnormal skin rashes Lymphatics: Denies new lymphadenopathy or easy bruising Neurological:Denies numbness, tingling or new weaknesses Behavioral/Psych: Mood is stable, no new changes  Skin: (+) rash in both bottom of her feet, and rashes around both ankles.  All other systems were reviewed with the patient and are negative.  PHYSICAL EXAMINATION: ECOG PERFORMANCE STATUS: 0 - Asymptomatic  Filed Vitals:   12/17/14 1538  BP: 131/79  Temp: 98.3 F (36.8 C)  Resp: 16   There were no vitals filed for this visit.  GENERAL:alert, no distress and comfortable SKIN: skin color, texture, turgor are normal, (+) a cluster of small papular rash in the middle of the bottom of her both feet, and around ankles. No petechia or ecchymosis. EYES: normal, conjunctiva are pink and non-injected, sclera clear OROPHARYNX:no exudate, no erythema and lips, buccal mucosa, and tongue normal  NECK: supple, thyroid normal size, non-tender, without nodularity LYMPH:  no palpable lymphadenopathy in the cervical, axillary or inguinal LUNGS: clear to auscultation and percussion with normal breathing effort HEART: regular rate & rhythm and no murmurs and no lower extremity edema ABDOMEN:abdomen soft, non-tender and normal bowel sounds Musculoskeletal:no cyanosis of digits and no clubbing  PSYCH: alert & oriented x 3 with fluent speech NEURO: no focal motor/sensory deficits  LABORATORY DATA:  I have reviewed the data as listed Lab Results  Component Value Date   WBC 8.4 12/17/2014   HGB 14.4 12/17/2014   HCT 42.8 12/17/2014   MCV 95.1 12/17/2014   PLT PENDING 12/17/2014   PLT PENDING 12/17/2014   No results for input(s): NA, K, CL, CO2, GLUCOSE, BUN, CREATININE, CALCIUM, GFRNONAA, GFRAA, PROT, ALBUMIN, AST, ALT, ALKPHOS, BILITOT, BILIDIR, IBILI in the last 8760  hours.  RADIOGRAPHIC STUDIES: I have personally reviewed the radiological images as listed and agreed with the findings in the report. No results found.  ASSESSMENT & PLAN: 54 year old Caucasian female, with past medical history of alcohol abuse, Wernicke's Korsakoff dementia, presented with incidental finding of poorly count in the range of 14-17K in the past 2 weeks. His platelet count was in the range of 119-136 in the past two years.  1. 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 -I would like to ruled out TTP, we'll check hemolysis labs including LDH, reticular count, haptoglobin, CMP, and I will review her peripheral blood smear to see if there are any schistocytes. -The other possibility is chronic alcohol related liver disease and spleenmegaly. However her CMP showed normal liver function, so this is less likely. -We'll also obtain DIC panel to ruled out chronic DIC -I do not think she needs a bone marrow biopsy, unless I see something suspicious on her peripheral smear. -I recommend to start dexa 46m daily X4 tonight to see if her plt improves -would consider IVIG if she  does not respond to dexa -No need plt transfusion unless severe bleeding, please call me in that case.  -If her plt is blow 20K, I recommend hospital admission for treatment and observation -I will follow closely.  Truitt Merle  12/17/2014 4pm   Addendum   Her lab results reviewed, plt 113K, DIC panel (-), LDH normal, CMP WNL, I also reviewed her peripheral blood smear, which showed adequate platelet, normal WBC and RBC morphology.   Given the mild and stable thrombocytopenia, I recommend no treatment for now, she will be discharged home. I spoke with pt, her daughter and ED physician. I will see her back in my clinic in a few months for follow up.   Truitt Merle   12/17/2014  6pm   Orders Placed This Encounter  Procedures  . CBC    Standing Status: Standing     Number of Occurrences: 1     Standing Expiration Date:   . Reticulocytes    Standing Status: Standing     Number of Occurrences: 1     Standing Expiration Date:   . CBC with Differential    Standing Status: Standing     Number of Occurrences: 1     Standing Expiration Date:   . Comprehensive metabolic panel    Standing Status: Standing     Number of Occurrences: 1     Standing Expiration Date:   . Lactate dehydrogenase    Standing Status: Standing     Number of Occurrences: 1     Standing Expiration Date:   . Haptoglobin    Standing Status: Standing     Number of Occurrences: 1     Standing Expiration Date:   . DIC panel    Standing Status: Standing     Number of Occurrences: 1     Standing Expiration Date:   . Platelet count    Standing Status: Standing     Number of Occurrences: 1     Standing Expiration Date:     All questions were answered. The patient knows to call the clinic with any problems, questions or concerns. I spent 40 minutes counseling the patient face to face. The total time spent in the appointment was 55 minutes and more than 50% was on counseling.     Truitt Merle, MD 12/17/2014 4:36 PM

## 2014-12-17 NOTE — ED Notes (Signed)
Pt being sent by PCP, due to abnormal labs.  PCP reports that Pt was recently started on Aricept.

## 2014-12-17 NOTE — ED Notes (Signed)
Awake. Verbally responsive. A/O x4. Resp even and unlabored. No audible adventitious breath sounds noted. ABC's intact. Family at bedside. IV saline lock patent and intact. 

## 2014-12-17 NOTE — ED Provider Notes (Addendum)
CSN: [id number]     Arrival date & time 12/17/14  1510 History   First MD Initiated Contact with Patient 12/17/14 1619     Chief Complaint  Patient presents with  . low platelet count      (Consider location/radiation/quality/duration/timing/severity/associated sxs/prior Treatment) HPI Patient is a 54 her old female with past history significant for 4 alcohol induced dementia. Presenting today with low platelets. Patient's had normal blood work done to go noted to have low platelets. It was repeated on Friday and noted to be residually low. Patient sent here by primary care provider. Seen by Dr. Mosetta Putt, hematologist in the waiting room and orders placed.  Patient denies any bleeding. Patient does endorse multiple bug bites on bilateral lower extremities   Past Medical History  Diagnosis Date  . High triglycerides   . Anxiety   . Depression   . 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 Wernicke-Korsakoff syndrome (alcoholic) 07/25/2014   Past Surgical History  Procedure Laterality Date  . Back surgery  1996    herniated disc Lower back   Family History  Problem Relation Age of Onset  . Lung cancer Father   . Breast cancer Mother    Social History  Substance Use Topics  . Smoking status: Current Every Day Smoker  . Smokeless tobacco: None  . Alcohol Use: No     Comment: 3wks sober    OB History    No data available     Review of Systems  Constitutional: Negative for activity change and fatigue.  HENT: Negative for congestion and drooling.   Eyes: Negative for discharge.  Respiratory: Negative for cough and chest tightness.   Cardiovascular: Negative for chest pain.  Gastrointestinal: Negative for abdominal distention.  Genitourinary: Negative for dysuria and difficulty urinating.  Musculoskeletal: Negative for joint swelling.  Skin: Positive for rash.  Allergic/Immunologic: Negative for immunocompromised state.  Neurological: Negative for seizures and speech  difficulty.  Psychiatric/Behavioral: Negative for behavioral problems and agitation.      Allergies  Review of patient's allergies indicates no known allergies.  Home Medications   Prior to Admission medications   Medication Sig Start Date End Date Taking? Authorizing Provider  citalopram (CELEXA) 20 MG tablet Take 1 tablet (20 mg total) by mouth daily. 08/13/14  Yes Carmen Dohmeier, MD  donepezil (ARICEPT) 10 MG tablet Take 1 tablet (10 mg total) by mouth daily. 09/07/14  Yes Nilda Riggs, NP  LORazepam (ATIVAN) 0.5 MG tablet Take 0.5 mg by mouth daily as needed for anxiety.   Yes Historical Provider, MD  Omega-3 Fatty Acids (FISH OIL PO) Take 2 capsules by mouth at bedtime.   Yes Historical Provider, MD  Prenatal Vit-Fe Fumarate-FA (NAT-RUL PRENATAL VITAMINS) 28-0.8 MG TABS 1 with each meal. Patient taking differently: Take 1 tablet by mouth 2 (two) times daily. 1 with each meal. 07/25/14  Yes Melvyn Novas, MD  TURMERIC PO Take 1 capsule by mouth at bedtime.   Yes Historical Provider, MD   BP 131/79 mmHg  Temp(Src) 98.3 F (36.8 C) (Oral)  Resp 16  SpO2 98% Physical Exam  Constitutional: She appears well-developed and well-nourished.  HENT:  Head: Normocephalic and atraumatic.  Eyes: Conjunctivae are normal. Right eye exhibits no discharge.  Neck: Neck supple.  Cardiovascular: Normal rate, regular rhythm and normal heart sounds.   No murmur heard. Pulmonary/Chest: Effort normal and breath sounds normal. She has no wheezes. She has no rales.  Abdominal: Soft. She exhibits no distension. There is  no tenderness.  Musculoskeletal: Normal range of motion. She exhibits no edema.  Neurological: No cranial nerve deficit.  Skin: Skin is warm and dry. Rash noted. She is not diaphoretic.  Multiple bug bites on bilateral ankles and dorsum of foot.  Psychiatric: She has a normal mood and affect.  Dementia.  Nursing note and vitals reviewed.   ED Course  Procedures  (including critical care time) Labs Review Labs Reviewed  CBC WITH DIFFERENTIAL/PLATELET - Abnormal; Notable for the following:    Platelets 113 (*)    All other components within normal limits  COMPREHENSIVE METABOLIC PANEL - Abnormal; Notable for the following:    Glucose, Bld 120 (*)    All other components within normal limits  DIC (DISSEMINATED INTRAVASCULAR COAGULATION) PANEL - Abnormal; Notable for the following:    Platelets 114 (*)    All other components within normal limits  RETICULOCYTES  LACTATE DEHYDROGENASE  CBC  HAPTOGLOBIN  PLATELET COUNT  EHRLICHIA ANTIBODY PANEL  ROCKY MTN SPOTTED FVR ABS PNL(IGG+IGM)  B. BURGDORFI ANTIBODIES    Imaging Review No results found. I have personally reviewed and evaluated these images and lab results as part of my medical decision-making.   EKG Interpretation None      MDM   Final diagnoses:  None    Patient is a 54 year old female with history of alcohol induced dementia presenting with low platelets.Platelets at PCP were 17K.  Patient was seen by Dr. Mosetta Putt in the emergency department and labs were ordered by them. Patient has no evidence of bleeding.  Platelet count here is 115. Which is just below normal. We sent her erlichia panel given multiple bug bites and thrombocytopenia.   Touched base with Dr. Mosetta Putt she's going to look at a smear self but if they are indeed 117 we will send discharge patient home and follow-up with primary care provider.  Jaceyon Strole Randall An, MD 12/17/14 1745  6:53 PM Mosetta Putt saw smear, it is normal.   Jocelyn Nold Randall An, MD 12/17/14 [id number]

## 2014-12-17 NOTE — ED Notes (Addendum)
Pt reported receiving call from PMD and was instructed to come for evaluation d/t low PLT of 114. Pt denies generalized weakness, lightheadedness/dizziness, visual disturbances, headaches or nausea. Pt reported being bitten via tick x1 week ago.

## 2014-12-17 NOTE — Discharge Instructions (Signed)
You were sent here because her platelets were low. As it turns out with our lab your platelets were not very low.  You had a hematologist see you and she believes it is safe to follow up later.

## 2014-12-17 NOTE — ED Notes (Signed)
Awake. Verbally responsive. A/O x4. Resp even and unlabored. No audible adventitious breath sounds noted. ABC's intact. IV saline lock patent and intact. Family at bedside. 

## 2014-12-17 NOTE — ED Notes (Signed)
Dr Mosetta Putt came up to triage, speaking to pt in consult room. Gave orders for blood work. Pt will be taken back to room shortly.

## 2014-12-17 NOTE — ED Notes (Signed)
Pt sent over from pcp office Dr Ronne Binning, for low platelet count. Blood work on Friday showed lot platelet count (17?), pt unsure of number. Pt denies n/v. Denies bruising.

## 2014-12-18 ENCOUNTER — Other Ambulatory Visit: Payer: Self-pay | Admitting: Hematology

## 2014-12-19 ENCOUNTER — Telehealth: Payer: Self-pay | Admitting: Hematology

## 2014-12-19 NOTE — Telephone Encounter (Signed)
Hosp F/U-s/w patient dtr Florentina Addison and gave appt for 09/22 @ 10 w/Dr. Mosetta Putt.

## 2014-12-24 ENCOUNTER — Telehealth (HOSPITAL_COMMUNITY): Payer: Self-pay

## 2015-01-01 LAB — HAPTOGLOBIN: Haptoglobin: 145

## 2015-01-02 LAB — ROCKY MTN SPOTTED FVR ABS PNL(IGG+IGM)
RMSF IGG: NEGATIVE
RMSF IGM: 0.39

## 2015-01-02 LAB — EHRLICHIA ANTIBODY PANEL
E CHAFFEENSIS AB, IGG: NEGATIVE
E CHAFFEENSIS AB, IGM: NEGATIVE

## 2015-01-02 LAB — B. BURGDORFI ANTIBODIES

## 2015-01-17 ENCOUNTER — Encounter: Payer: Self-pay | Admitting: Hematology

## 2015-01-17 ENCOUNTER — Encounter: Payer: BLUE CROSS/BLUE SHIELD | Admitting: Hematology

## 2015-01-17 DIAGNOSIS — D696 Thrombocytopenia, unspecified: Secondary | ICD-10-CM

## 2015-01-17 NOTE — Progress Notes (Signed)
Pt did not want to wait to be seen and left. Will reschedule.       This encounter was created in error - please disregard.

## 2015-01-21 ENCOUNTER — Telehealth: Payer: Self-pay | Admitting: Hematology

## 2015-01-21 NOTE — Telephone Encounter (Signed)
inbox to him to reschedule her appointment

## 2015-01-29 ENCOUNTER — Other Ambulatory Visit: Payer: Self-pay | Admitting: Neurology

## 2015-02-08 ENCOUNTER — Encounter: Payer: BLUE CROSS/BLUE SHIELD | Admitting: Hematology

## 2015-02-08 ENCOUNTER — Encounter: Payer: Self-pay | Admitting: Hematology

## 2015-02-08 ENCOUNTER — Telehealth: Payer: Self-pay | Admitting: Hematology

## 2015-02-08 NOTE — Progress Notes (Signed)
No show  This encounter was created in error - please disregard.

## 2015-02-08 NOTE — Telephone Encounter (Signed)
Called patient dtr(POA) per [name] will have to call back to r/s appt

## 2015-02-13 ENCOUNTER — Other Ambulatory Visit: Payer: Self-pay | Admitting: Nurse Practitioner

## 2015-03-04 ENCOUNTER — Telehealth: Payer: Self-pay | Admitting: Nurse Practitioner

## 2015-03-04 NOTE — Telephone Encounter (Signed)
LMVM that was returning call [place], [place].

## 2015-03-04 NOTE — Telephone Encounter (Signed)
Pt's daughter requesting to speak with NP prior to appt on 03/14/15. She sts there have been changes in the past 6mths and if she brings issues up at Dr appt mother becomes defensive. Please call and advise

## 2015-03-06 NOTE — Telephone Encounter (Signed)
[facility], again to return call at her convenience.

## 2015-03-11 ENCOUNTER — Ambulatory Visit: Payer: Self-pay | Admitting: Nurse Practitioner

## 2015-03-14 ENCOUNTER — Ambulatory Visit (INDEPENDENT_AMBULATORY_CARE_PROVIDER_SITE_OTHER): Payer: BLUE CROSS/BLUE SHIELD | Admitting: Nurse Practitioner

## 2015-03-14 ENCOUNTER — Encounter: Payer: Self-pay | Admitting: Nurse Practitioner

## 2015-03-14 VITALS — BP 122/75 | HR 76 | Ht 68.0 in | Wt 151.0 lb

## 2015-03-14 DIAGNOSIS — F1097 Alcohol use, unspecified with alcohol-induced persisting dementia: Secondary | ICD-10-CM

## 2015-03-14 DIAGNOSIS — F1096 Alcohol use, unspecified with alcohol-induced persisting amnestic disorder: Secondary | ICD-10-CM

## 2015-03-14 DIAGNOSIS — F1027 Alcohol dependence with alcohol-induced persisting dementia: Secondary | ICD-10-CM

## 2015-03-14 MED ORDER — DONEPEZIL HCL 10 MG PO TABS: ORAL_TABLET | ORAL | Status: DC

## 2015-03-14 NOTE — Patient Instructions (Signed)
Continue Aricept at current dose will refill May stop prenatal vitamins due to stomach upset  Follow-up in 8 months

## 2015-03-14 NOTE — Progress Notes (Signed)
[facility]  PATIENT: Sherry Scott DOB: 1960/07/22   REASON FOR VISIT: Dementia associated with alcoholism, alcohol-induced amnesia HISTORY FROM: Patient and daughter    HISTORY OF PRESENT ILLNESS:Ms. Sherry Scott, 54 year old female returns for follow-up. She was initially evaluated by Dr. Vickey Hugerohmeier 07/25/2014. She has a history of dementia most likely related to her alcohol use. She is now sober and seeing a substance abuse counselor. EEG with mild background slowing no evidence of seizure activity. MRI shows small vessel disease. She was placed on low dose Aricept by Dr. Anne HahnWillis in Dr. Oliva Bustardohmeier's absence. She feels this has helped and she denies any side effects to the medication. She is also on Celexa 20 mg daily and B complex  vitamins. Appetite is good and she is sleeping well. She had recent diagnosis of thrombocytopenia .She returns for reevaluation  HISTORY: Sherry Scott is a 54 y.o. caucasian , rigt handed female, seen here as a referral from Dr. Thea SilversmithMackenzie for memory loss,  Mrs. Sherry Scott is referred today for evaluation of memory loss or dementia by Dr. Ferd HibbsMcKinsey, her primary care physician the patient has been endorsing a past medical history of depression and anxiety and she had a back surgery in 1996, she is a current smoker endorsed some blurred vision memory loss and confusional spells as well as still some anxiety in her review of systems. She stated that the prescribed alprazolam is not what she likes to use- and that it doesn't make her feel very good. She is on an SSRI, which is citalopram 10 mg daily. The patient believes that she has more seasonal depression and that winter is a gloomy time for her. About one year ago, in March 2015 ,the patient first noticed memory difficulties. This alone also she is accompanied today by her daughter. Dr. Ronne BinningMcKenzie brought on 07-03-14 that the patient recently got lost in her own neighborhood, a fact she has already forgotten.  She lives 25 years in the same area. She used to be a heavy drinker, still smokes. She forgot meals. She has good and bad days, she is remarkably unconcerned and her daughter had to call the maternal aunt to get someone to confront and change her mother. She has been sober for only 3 weeks. She has difficulties for over 2-3 years according to the daughter. Her mother lives with 4 dogs.   [name], Sedalia MutaDiane, came to assist in the task about 3 weeks ago. The patient had difficulties with paying the bills, and she had also not filed her taxes, not paid bills and she had not renewed tags on her car, expired last January. The patient is at this time not gainfully employed, she does keep her own house and she also long etc. she takes care of her 4 dogs, at this time unassisted. Her daughter feels the house is not kept clean.  The patient has all her daily routines cycle around the need of the animals. She has not worked out side the home. 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   The patient has begun to take B complex vitamins. I reviewed today's Mini-Mental Status Examination with Mrs. Sherry Scott, a 21 out of 30 points. The main point loss was in the 3 words of recall, but in the visual spatial orientation she was not able to draw clock face today. She was able to copy an image of 5 pentagons overlapping, and she generated 8 animal names. She mistook the months of the year for April,  stated that it approached summer but could not find the word for spring, and was not sure about the date of course. Attention and calculation were attempted but could not be performed and she scored only 2 out of 5 points in spelling backwards.    REVIEW OF SYSTEMS: Full 14 system review of systems performed and notable only for those listed, all others are neg:  Constitutional: neg  Cardiovascular: neg Ear/Nose/Throat: neg  Skin: neg Eyes: neg Respiratory: neg Gastroitestinal: neg  Hematology/Lymphatic:  neg  Endocrine: neg Musculoskeletal:neg Allergy/Immunology: neg Neurological: neg Psychiatric: neg Sleep : neg   ALLERGIES: No Known Allergies  HOME MEDICATIONS: Outpatient Prescriptions Prior to Visit  Medication Sig Dispense Refill  . citalopram (CELEXA) 20 MG tablet TAKE 1 TABLET (20 MG TOTAL) BY MOUTH DAILY. 90 tablet 0  . donepezil (ARICEPT) 10 MG tablet TAKE 1 TABLET (10 MG TOTAL) BY MOUTH DAILY. 30 tablet 0  . LORazepam (ATIVAN) 0.5 MG tablet Take 0.5 mg by mouth daily as needed for anxiety.    . Omega-3 Fatty Acids (FISH OIL PO) Take 2 capsules by mouth at bedtime.    . Prenatal Vit-Fe Fumarate-FA (NAT-RUL PRENATAL VITAMINS) 28-0.8 MG TABS 1 with each meal. (Patient taking differently: Take 1 tablet by mouth 2 (two) times daily. 1 with each meal.) 90 tablet 11  . TURMERIC PO Take 1 capsule by mouth at bedtime.     No facility-administered medications prior to visit.    PAST MEDICAL HISTORY: Past Medical History  Diagnosis Date  . High triglycerides   . Anxiety   . Depression   . 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 Back surgery  1996    herniated disc Lower back    FAMILY HISTORY: Family History  Problem Relation Age of Onset  . Lung cancer Father   . Breast cancer Mother     SOCIAL HISTORY: Social History   Social History  . Marital Status: Single    Spouse Name: N/A  . Number of Children: 1  . Years of Education: 2y coll   Occupational History  . retired    Social History Main Topics  . Smoking status: Current Every Day Smoker -- 0.50 packs/day    Types: Cigarettes  . Smokeless tobacco: Not on file  . Alcohol Use: No     Comment: 3wks sober   . Drug Use: No  . Sexual Activity: Not on file   Other Topics Concern  . Not on file   Social History Narrative   Caffeine 5 cups coffee daily  (weak).     PHYSICAL EXAM  Filed Vitals:   03/14/15 1558  BP: 122/75  Pulse: 76  Height:  (1.727 m)  Weight: 151 lb (68.493 kg)   Body mass index is 22.96 kg/(m^2). General: The patient is awake, alert and appears not in acute distress.  Head: Normocephalic, atraumatic.  Neck is supple. . Cardiovascular: Regular rate and rhythm , without murmurs Respiratory: Lungs are clear to auscultation. Skin: Spider naevi,   Neurologic exam : The patient is awake and alert, oriented to place and time. Memory subjective described as stable .MMSE 22/30. AFT 11. Mood and affect are appropriate.  Cranial nerves:Pupils are equal and briskly reactive to light. Funduscopic exam without evidence of pallor or edema. Extraocular movements in vertical and horizontal planes intact and without nystagmus. Visual fields by  finger perimetry are intact.Hearing to finger rub intact. Facial sensation intact to fine touch. Facial motor strength is symmetric and tongue and uvula move midline. Tongue protrusion into either cheek is normal. Shoulder shrug is normal.  Motor exam: Normal tone ,muscle bulk and symmetric strength in all extremities. Sensory: Fine touch, pinprick and vibration were tested in all extremities. Proprioception was normal. Coordination: Rapid alternating movements in the fingers/hands were normal. Finger-to-nose maneuver normal without evidence of ataxia, dysmetria or tremor. Gait and station: Patient walks without assistive device and is able unassisted to climb up to the exam table. Strength within normal limits. Stance is stable and normal. Tandem gait is unfragmented. Romberg testing is negative  Deep tendon reflexes: in the upper and lower extremities are brisk, symmetric and intact. Babinski maneuver downgoing.    DIAGNOSTIC DATA (LABS, IMAGING, TESTING) - I reviewed patient records, labs, notes, testing and imaging myself where available.  Lab Results    Component Value Date   WBC 8.4 12/17/2014   HGB 14.4 12/17/2014   HCT 42.8 12/17/2014   MCV 95.1 12/17/2014   PLT 113* 12/17/2014   PLT 114* 12/17/2014      Component Value Date/Time   NA 144 12/17/2014 1602   K 3.9 12/17/2014 1602   CL 105 12/17/2014 1602   CO2 30 12/17/2014 1602   GLUCOSE 120* 12/17/2014 1602   BUN 12 12/17/2014 1602   CREATININE 0.95 12/17/2014 1602   CALCIUM 9.6 12/17/2014 1602   PROT 7.4 12/17/2014 1602   ALBUMIN 4.4 12/17/2014 1602   AST 22 12/17/2014 1602   ALT 21 12/17/2014 1602   ALKPHOS 67 12/17/2014 1602   BILITOT 1.2 12/17/2014 1602   GFRNONAA >60 12/17/2014 1602   GFRAA >60 12/17/2014 1602    ASSESSMENT AND PLAN  54 y.o. year old female  has a past medical history of  Alcoholism associated with dementia (HCC) (07/25/2014); and Wernicke-Korsakoff syndrome (alcoholic) (HCC) (7/82/9562). here to follow-up.  Continue Aricept at current dose will refill May stop prenatal vitamins due to stomach upset  Follow-up in 8 months Nilda Riggs, [facility], [facility], APRN  [facility] [address], [address] [place], [place] [address] [phone]

## 2015-03-15 NOTE — Progress Notes (Signed)
I agree with the assessment and plan as directed by NP .The patient is known to me .   Pascuala Klutts, MD  

## 2015-05-02 ENCOUNTER — Other Ambulatory Visit: Payer: Self-pay | Admitting: Neurology

## 2015-10-13 ENCOUNTER — Other Ambulatory Visit: Payer: Self-pay | Admitting: Neurology

## 2015-11-14 ENCOUNTER — Ambulatory Visit (INDEPENDENT_AMBULATORY_CARE_PROVIDER_SITE_OTHER): Payer: BLUE CROSS/BLUE SHIELD | Admitting: Nurse Practitioner

## 2015-11-14 ENCOUNTER — Encounter: Payer: Self-pay | Admitting: Nurse Practitioner

## 2015-11-14 VITALS — BP 108/64 | HR 64 | Ht 68.0 in | Wt 154.0 lb

## 2015-11-14 DIAGNOSIS — F1096 Alcohol use, unspecified with alcohol-induced persisting amnestic disorder: Secondary | ICD-10-CM | POA: Diagnosis not present

## 2015-11-14 DIAGNOSIS — F1027 Alcohol dependence with alcohol-induced persisting dementia: Secondary | ICD-10-CM

## 2015-11-14 DIAGNOSIS — F1097 Alcohol use, unspecified with alcohol-induced persisting dementia: Secondary | ICD-10-CM | POA: Diagnosis not present

## 2015-11-14 MED ORDER — DONEPEZIL HCL 10 MG PO TABS: ORAL_TABLET | ORAL | Status: DC

## 2015-11-14 NOTE — Patient Instructions (Signed)
Continue Aricept at current dose will refill Continue Celexa at current dose  Follow-up in 8 months

## 2015-11-14 NOTE — Progress Notes (Signed)
[facility]  PATIENT: Sherry Scott DOB: March 12, 1961   REASON FOR VISIT: Follow-up for dementia associated with alcoholism HISTORY FROM: Patient and daughter    HISTORY OF PRESENT ILLNESS:UPDATE 07/20/2017CM Sherry Scott, 55 year old female returns for follow-up. She has a history of dementia most likely related to her previous alcohol use. She is now sober. She is currently on Aricept without side effects. She feels this is helping her memory. She is also on Celexa. Appetite is good and she has gained weight since last seen. Sleeping well at night. Has a garden this year. Daughter checks on her frequently. Daughter says she has good days and bad days. She returns for reevaluation    UPDATE 03/14/15 CMMs. Sherry Scott, 55 year old female returns for follow-up. She was initially evaluated by Dr. Vickey Huger 07/25/2014. She has a history of dementia most likely related to her alcohol use. She is now sober and seeing a substance abuse counselor. EEG with mild background slowing no evidence of seizure activity. MRI shows small vessel disease. She was placed on low dose Aricept by Dr. Anne Hahn in Dr. Oliva Bustard absence. She feels this has helped and she denies any side effects to the medication. She is also on Celexa 20 mg daily and B complex prenatal vitamins. Appetite is good and she is sleeping well. She returns for reevaluation    3/30/16CDMrs. Scott is referred today for evaluation of memory loss or dementia by Dr. Ferd Hibbs, her primary care physician the patient has been endorsing a past medical history of depression and anxiety and she had a back surgery in 1996, she is a current smoker endorsed some blurred vision memory loss and confusional spells as well as still some anxiety in her review of systems. She stated that the prescribed alprazolam is not what she likes to use- and that it doesn't make her feel very good. She is on an SSRI, which is citalopram 10 mg daily. The patient  believes that she has more seasonal depression and that winter is a gloomy time for her. About one year ago, in March 2015 ,the patient first noticed memory difficulties. This alone also she is accompanied today by her daughter. Dr. Ronne Binning brought on 07-03-14 that the patient recently got lost in her own neighborhood, a fact she has already forgotten. She lives 25 years in the same area. She used to be a heavy drinker, still smokes. She forgot meals. She has good and bad days, she is remarkably unconcerned and her daughter had to call the maternal aunt to get someone to confront and change her mother. She has been sober for only 3 weeks. She has difficulties for over 2-3 years according to the daughter. Her mother lives with 4 dogs.   [name], [name], came to assist in the task about 3 weeks ago. The patient had difficulties with paying the bills, and she had also not filed her taxes, not paid bills and she had not renewed tags on her car, expired last January. The patient is at this time not gainfully employed, she does keep her own house and she also long etc. she takes care of her 4 dogs, at this time unassisted. Her daughter feels the house is not kept clean.  The patient has all her daily routines cycle around the need of the animals. She has not worked out side the home. She retired from her own Furniture conservator/restorer business.   The patient has begun to take B complex vitamins. I reviewed today's Mini-Mental Status  Examination with Sherry Scott, a 21 out of 30 points. The main point loss was in the 3 words of recall, but in the visual spatial orientation she was not able to draw clock face today. She was able to copy an image of 5 pentagons overlapping, and she generated 8 animal names. She mistook the months of the year for April, stated that it approached summer but could not find the word for spring, and was not sure about the date of course. Attention and calculation were attempted but could  not be performed and she scored only 2 out of 5 points in spelling backwards.   REVIEW OF SYSTEMS: Full 14 system review of systems performed and notable only for those listed, all others are neg:  Constitutional: Fatigue Cardiovascular: neg Ear/Nose/Throat: neg  Skin: neg Eyes: neg Respiratory: neg Gastroitestinal: neg  Hematology/Lymphatic: neg  Endocrine: neg Musculoskeletal:neg Allergy/Immunology: neg Neurological: Speech difficulty Psychiatric: neg Sleep : neg   ALLERGIES: No Known Allergies  HOME MEDICATIONS: Outpatient Prescriptions Prior to Visit  Medication Sig Dispense Refill  . citalopram (CELEXA) 20 MG tablet TAKE 1 TABLET (20 MG TOTAL) BY MOUTH DAILY. 90 tablet 1  . donepezil (ARICEPT) 10 MG tablet TAKE 1 TABLET (10 MG TOTAL) BY MOUTH DAILY. 30 tablet 8  . LORazepam (ATIVAN) 0.5 MG tablet Take 0.5 mg by mouth daily as needed for anxiety.    . Omega-3 Fatty Acids (FISH OIL PO) Take 2 capsules by mouth at bedtime.    . TURMERIC PO Take 1 capsule by mouth at bedtime.     No facility-administered medications prior to visit.    PAST MEDICAL HISTORY: Past Medical History  Diagnosis Date  . High triglycerides   . Anxiety   . Depression   . 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 Back surgery  1996    herniated disc Lower back    FAMILY HISTORY: Family History  Problem Relation Age of Onset  . Lung cancer Father   . Breast cancer Mother     SOCIAL HISTORY: Social History   Social History  . Marital Status: Single    Spouse Name: N/A  . Number of Children: 1  . Years of Education: 2y coll   Occupational History  . retired    Social History Main Topics  . Smoking status: Current Every Day Smoker -- 0.50 packs/day    Types: Cigarettes  . Smokeless tobacco: Not on file  . Alcohol Use: No      Comment: 3wks sober   . Drug Use: No  . Sexual Activity: Not on file   Other Topics Concern  . Not on file   Social History Narrative   Caffeine 5 cups coffee daily (weak).   Lives with     PHYSICAL EXAM  Filed Vitals:   11/14/15 1545  Height: 5\' 8"  (1.727 m)  Weight: 154 lb (69.854 kg)   Body mass index is 23.42 kg/(m^2). General: The patient is awake, alert and appears not in acute distress.  Head: Normocephalic, atraumatic.  Neck is supple. . Cardiovascular: Regular rate and rhythm , without murmurs Respiratory: Lungs are clear to auscultation.  Neurologic exam : The patient is awake and alert, oriented to place and time. Memory subjective described as stable .MMSE 19/30. Last MMSE 22/30. AFT 7. Mood and affect are appropriate.  Cranial nerves:Pupils are equal and briskly reactive  to light. Funduscopic exam without evidence of pallor or edema. Extraocular movements in vertical and horizontal planes intact and without nystagmus. Visual fields by finger perimetry are intact.Hearing to finger rub intact. Facial sensation intact to fine touch. Facial motor strength is symmetric and tongue and uvula move midline. Tongue protrusion into either cheek is normal. Shoulder shrug is normal.  Motor exam: Normal tone ,muscle bulk and symmetric strength in all extremities. Sensory: Fine touch, pinprick and vibration were tested in all extremities.  Coordination: Rapid alternating movements in the fingers/hands were normal. Finger-to-nose maneuver normal without evidence of ataxia, dysmetria or tremor. Gait and station: Patient walks without assistive device and is able unassisted to climb up to the exam table. Strength within normal limits. Stance is stable and normal. Tandem gait is steady. Romberg testing is negative  Deep tendon reflexes: in the upper and lower extremities are brisk, symmetric and intact. Babinski maneuver downgoing.  DIAGNOSTIC DATA (LABS, IMAGING,  TESTING)   ASSESSMENT AND PLAN 55 y.o. year old female has a past medical history of Alcoholism associated with dementia (HCC) (07/25/2014); and Wernicke-Korsakoff syndrome (alcoholic) (HCC) (1/61/0960). here to follow-up.  Continue Aricept at current dose will refill Continue Celexa at current dose  Continue to exercise for overall health and well-being, healthy diet with whole grains  fresh fruits and vegetables Follow-up in 8 months Nilda Riggs, [facility], [facility], APRN  [facility] [address] [place], [place] [address] [phone]

## 2015-11-15 NOTE — Progress Notes (Signed)
I agree with the assessment and plan as directed by NP .The patient is known to me .   Rutherford Alarie, MD  

## 2016-01-09 ENCOUNTER — Other Ambulatory Visit: Payer: Self-pay | Admitting: Nurse Practitioner

## 2016-02-07 ENCOUNTER — Encounter: Payer: Self-pay | Admitting: Nurse Practitioner

## 2016-02-07 ENCOUNTER — Ambulatory Visit (INDEPENDENT_AMBULATORY_CARE_PROVIDER_SITE_OTHER): Payer: BLUE CROSS/BLUE SHIELD | Admitting: Nurse Practitioner

## 2016-02-07 VITALS — BP 106/66 | HR 72 | Ht 68.0 in | Wt 153.6 lb

## 2016-02-07 DIAGNOSIS — F1097 Alcohol use, unspecified with alcohol-induced persisting dementia: Secondary | ICD-10-CM

## 2016-02-07 DIAGNOSIS — F1027 Alcohol dependence with alcohol-induced persisting dementia: Secondary | ICD-10-CM

## 2016-02-07 DIAGNOSIS — F1096 Alcohol use, unspecified with alcohol-induced persisting amnestic disorder: Secondary | ICD-10-CM

## 2016-02-07 MED ORDER — MEMANTINE HCL 28 X 5 MG & 21 X 10 MG PO TABS
ORAL_TABLET | ORAL | 0 refills | Status: DC
Start: 2016-02-07 — End: 2016-03-02

## 2016-02-07 NOTE — Patient Instructions (Signed)
Continue Aricept at current dose Add  on Namenda titration pack to end up with 10 mg twice daily Follow-up in 6 months next visit with Dr. Vickey Hugerohmeier

## 2016-02-07 NOTE — Progress Notes (Signed)
[facility]  PATIENT: Sherry Scott DOB: [birth year]   REASON FOR VISIT: Follow-up for dementia associated with alcoholism HISTORY FROM: Patient and daughter    HISTORY OF PRESENT ILLNESS:UPDATE 10/13/2017CM Sherry Scott, 55 year old female returns for follow-up with her daughter. She has a history of memory loss most likely related to her previous excessive alcohol use. She is now sober. She is currently on Aricept without side effects however she continues to have fluctuations in her memory on a day-to-day basis. She has someone that comes in Monday through Saturday 4 hours to assist with errands shopping etc. Daughter checks on her frequently as well. She claims she is sleeping well at night. She has been found to have low platelet count and is due to see hematology. She returns for reevaluation    UPDATE 07/20/2017CM Sherry Scott, 55 year old female returns for follow-up. She has a history of dementia most likely related to her previous alcohol use. She is now sober. She is currently on Aricept without side effects. She feels this is helping her memory. She is also on Celexa. Appetite is good and she has gained weight since last seen. Sleeping well at night. Has a garden this year. Daughter checks on her frequently. Daughter says she has good days and bad days. She returns for reevaluation    UPDATE 03/14/15 CMMs. Sherry Scott, 55 year old female returns for follow-up. She was initially evaluated by Dr. Vickey Scott 07/25/2014. She has a history of dementia most likely related to her alcohol use. She is now sober and seeing a substance abuse counselor. EEG with mild background slowing no evidence of seizure activity. MRI shows small vessel disease. She was placed on low dose Aricept by Dr. Anne Scott in Dr. Oliva Scott absence. She feels this has helped and she denies any side effects to the medication. She is also on Celexa 20 mg daily and B complex prenatal vitamins. Appetite is good  and she is sleeping well. She returns for reevaluation    3/30/16CDMrs. Scott is referred today for evaluation of memory loss or dementia by Dr. Ferd Scott, her primary care physician the patient has been endorsing a past medical history of depression and anxiety and she had a back surgery in 1996, she is a current smoker endorsed some blurred vision memory loss and confusional spells as well as still some anxiety in her review of systems. She stated that the prescribed alprazolam is not what she likes to use- and that it doesn't make her feel very good. She is on an SSRI, which is citalopram 10 mg daily. The patient believes that she has more seasonal depression and that winter is a gloomy time for her. About one year ago, in March 2015 ,the patient first noticed memory difficulties. This alone also she is accompanied today by her daughter. Dr. Ronne Scott brought on 07-03-14 that the patient recently got lost in her own neighborhood, a fact she has already forgotten. She lives 25 years in the same area. She used to be a heavy drinker, still smokes. She forgot meals. She has good and bad days, she is remarkably unconcerned and her daughter had to call the maternal aunt to get someone to confront and change her mother. She has been sober for only 3 weeks. She has difficulties for over 2-3 years according to the daughter. Her mother lives with 4 dogs.   [name], Sherry Scott, came to assist in the task about 3 weeks ago. The patient had difficulties with paying the bills, and she had  also not filed her taxes, not paid bills and she had not renewed tags on her car, expired last January. The patient is at this time not gainfully employed, she does keep her own house and she also long etc. she takes care of her 4 dogs, at this time unassisted. Her daughter feels the house is not kept clean.  The patient has all her daily routines cycle around the need of the animals. She has not worked out side the home.  She retired from her own Furniture conservator/restorer business.   The patient has begun to take B complex vitamins. I reviewed today's Mini-Mental Status Examination with Sherry Scott, a 21 out of 30 points. The main point loss was in the 3 words of recall, but in the visual spatial orientation she was not able to draw clock face today. She was able to copy an image of 5 pentagons overlapping, and she generated 8 animal names. She mistook the months of the year for April, stated that it approached summer but could not find the word for spring, and was not sure about the date of course. Attention and calculation were attempted but could not be performed and she scored only 2 out of 5 points in spelling backwards.   REVIEW OF SYSTEMS: Full 14 system review of systems performed and notable only for those listed, all others are neg:  Constitutional: Fatigue Cardiovascular: neg Ear/Nose/Throat: neg  Skin: neg Eyes: neg Respiratory: neg Gastroitestinal: neg  Hematology/Lymphatic: Low platelets  Endocrine: neg Musculoskeletal:neg Allergy/Immunology: neg Neurological: Memory loss Psychiatric: neg Sleep : neg   ALLERGIES: No Known Allergies  HOME MEDICATIONS: Outpatient Medications Prior to Visit  Medication Sig Dispense Refill  . citalopram (CELEXA) 20 MG tablet TAKE 1 TABLET (20 MG TOTAL) BY MOUTH DAILY. 90 tablet 1  . donepezil (ARICEPT) 10 MG tablet TAKE 1 TABLET (10 MG TOTAL) BY MOUTH DAILY. 30 tablet 8  . LORazepam (ATIVAN) 0.5 MG tablet Take 0.5 mg by mouth daily as needed for anxiety.    . Omega-3 Fatty Acids (FISH OIL PO) Take 2 capsules by mouth at bedtime.    . TURMERIC PO Take 1 capsule by mouth at bedtime.     No facility-administered medications prior to visit.     PAST MEDICAL HISTORY: Past Medical History:  Diagnosis Date  . 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 Anxiety   . Depression   . High triglycerides   . Wernicke-Korsakoff syndrome (alcoholic) (HCC)  4/54/0981    PAST SURGICAL HISTORY: Past Surgical History:  Procedure Laterality Date  . BACK SURGERY  1996   herniated disc Lower back    FAMILY HISTORY: Family History  Problem Relation Age of Onset  . Breast cancer Mother   . Lung cancer Father     SOCIAL HISTORY: Social History   Social History  . Marital status: Single    Spouse name: N/A  . Number of children: 1  . Years of education: 2y coll   Occupational History  . retired    Social History Main Topics  . Smoking status: Current Every Day Smoker    Packs/day: 0.50    Types: Cigarettes  . Smokeless tobacco: Never Used  . Alcohol use No     Comment: 3wks sober   . Drug use: No  . Sexual activity: Not on file   Other Topics Concern  . Not on file   Social History Narrative   Caffeine 5 cups coffee daily (weak).   Lives  with     PHYSICAL EXAM  Vitals:   02/07/16 1125  BP: 106/66  Pulse: 72  Weight: 153 lb 9.6 oz (69.7 kg)  Height: 5\' 8"  (1.727 m)   Body mass index is 23.35 kg/m. General: The patient is awake, alert and appears not in acute distress.  Head: Normocephalic, atraumatic.  Neck is supple. . Cardiovascular: Regular rate and rhythm , without murmurs Respiratory: Lungs are clear to auscultation.  Neurologic exam : The patient is awake and alert, oriented to place and time. Memory subjective described as stable .MMSE 17/30. Last MMSE 19/30. AFT 4. Unable to draw a clock. Mood and affect are appropriate.  Cranial nerves:Pupils are equal and briskly reactive to light. Funduscopic exam without evidence of pallor or edema. Extraocular movements in vertical and horizontal planes intact and without nystagmus. Visual fields by finger perimetry are intact.Hearing to finger rub intact. Facial sensation intact to fine touch. Facial motor strength is symmetric and tongue and uvula move midline. Tongue protrusion into either cheek is normal. Shoulder shrug is normal.  Motor exam:  Normal tone ,muscle bulk and symmetric strength in all extremities. Sensory: Fine touch, pinprick and vibration were tested in all extremities.  Coordination: Rapid alternating movements in the fingers/hands were normal. Finger-to-nose maneuver normal without evidence of ataxia, dysmetria or tremor. Gait and station: Patient walks without assistive device and is able unassisted to climb up to the exam table. Strength within normal limits. Stance is stable and normal. Tandem gait is steady. Romberg testing is negative  Deep tendon reflexes: in the upper and lower extremities are brisk, symmetric and intact. Babinski maneuver downgoing.  DIAGNOSTIC DATA (LABS, IMAGING, TESTING)   ASSESSMENT AND PLAN 55 y.o. year old female has a past medical history of Alcoholism associated with dementia (HCC) (07/25/2014); and Wernicke-Korsakoff syndrome (alcoholic) (HCC) (1/61/09603/30/2016). here to follow-up.  Continue Aricept at current dose Continue Celexa at current dose Add  on Namenda titration pack to end up with 10 mg twice daily  Continue to exercise for overall health and well-being, healthy diet with whole grains  fresh fruits and vegetables Follow-up in 6 months next visit with Dr. Sheppard Evensohmeier Nancy Carolyn Martin, [facility] CenterGNP, [facility], APRN  [facility], [facility] [address], [address] [place], [place] [address] [phone]

## 2016-02-10 ENCOUNTER — Encounter: Payer: Self-pay | Admitting: Nurse Practitioner

## 2016-02-10 NOTE — Progress Notes (Signed)
I agree with the assessment and plan as directed by NP .The patient is known to me .   Madelynne Lasker, MD  

## 2016-02-10 NOTE — Progress Notes (Signed)
Fax confirmation for namenda titration pack to [phone]. [facility].

## 2016-02-12 ENCOUNTER — Other Ambulatory Visit: Payer: Self-pay | Admitting: Nurse Practitioner

## 2016-02-12 DIAGNOSIS — R413 Other amnesia: Secondary | ICD-10-CM

## 2016-02-12 DIAGNOSIS — F1097 Alcohol use, unspecified with alcohol-induced persisting dementia: Secondary | ICD-10-CM

## 2016-02-19 ENCOUNTER — Telehealth: Payer: Self-pay | Admitting: Hematology

## 2016-02-19 NOTE — Telephone Encounter (Signed)
Left a vm w/pt's daughter to cb to schedule an appt.

## 2016-02-21 ENCOUNTER — Telehealth: Payer: Self-pay | Admitting: Nurse Practitioner

## 2016-02-21 NOTE — Telephone Encounter (Signed)
This patient's MRI is requiring a p2p because she just had an MRI in April. The phone number is [phone]. Please call within the next two business days.

## 2016-02-25 NOTE — Telephone Encounter (Signed)
Called daughter and made her aware insurance does not want to cover another MRI now. Pt requested after her last visit  and no real change in last visit. She verbalizes understanding

## 2016-03-02 ENCOUNTER — Other Ambulatory Visit: Payer: Self-pay | Admitting: Nurse Practitioner

## 2016-03-02 ENCOUNTER — Other Ambulatory Visit: Payer: Self-pay | Admitting: *Deleted

## 2016-03-02 MED ORDER — MEMANTINE HCL 10 MG PO TABS: 10.0000 mg | ORAL_TABLET | Freq: Two times a day (BID) | ORAL | 5 refills | Status: DC

## 2016-03-13 ENCOUNTER — Ambulatory Visit (HOSPITAL_BASED_OUTPATIENT_CLINIC_OR_DEPARTMENT_OTHER): Payer: BLUE CROSS/BLUE SHIELD | Admitting: Hematology

## 2016-03-13 ENCOUNTER — Telehealth: Payer: Self-pay | Admitting: Hematology

## 2016-03-13 ENCOUNTER — Ambulatory Visit (HOSPITAL_BASED_OUTPATIENT_CLINIC_OR_DEPARTMENT_OTHER): Payer: BLUE CROSS/BLUE SHIELD

## 2016-03-13 VITALS — BP 135/81 | HR 75 | Temp 98.4°F | Resp 18 | Ht 68.0 in | Wt 164.8 lb

## 2016-03-13 DIAGNOSIS — D696 Thrombocytopenia, unspecified: Secondary | ICD-10-CM

## 2016-03-13 LAB — COMPREHENSIVE METABOLIC PANEL
ALT: 30 U/L (ref 0–55)
AST: 22 U/L (ref 5–34)
Albumin: 3.7 g/dL (ref 3.5–5.0)
Alkaline Phosphatase: 101 U/L (ref 40–150)
Anion Gap: 8 mEq/L (ref 3–11)
BILIRUBIN TOTAL: 0.47 mg/dL (ref 0.20–1.20)
BUN: 14.8 mg/dL (ref 7.0–26.0)
CO2: 29 meq/L (ref 22–29)
CREATININE: 0.8 mg/dL (ref 0.6–1.1)
Calcium: 9.6 mg/dL (ref 8.4–10.4)
Chloride: 106 mEq/L (ref 98–109)
EGFR: 78 mL/min/{1.73_m2} — ABNORMAL LOW (ref >90)
GLUCOSE: 100 mg/dL (ref 70–140)
Potassium: 4.4 mEq/L (ref 3.5–5.1)
SODIUM: 143 meq/L (ref 136–145)
TOTAL PROTEIN: 6.9 g/dL (ref 6.4–8.3)

## 2016-03-13 LAB — CBC WITH DIFFERENTIAL/PLATELET
BASO%: 0.4 % (ref 0.0–2.0)
Basophils Absolute: 0 10*3/uL (ref 0.0–0.1)
EOS%: 1.7 % (ref 0.0–7.0)
Eosinophils Absolute: 0.2 10*3/uL (ref 0.0–0.5)
HCT: 42.4 % (ref 34.8–46.6)
HGB: 14.4 g/dL (ref 11.6–15.9)
LYMPH#: 4.1 10*3/uL — AB (ref 0.9–3.3)
LYMPH%: 38.4 % (ref 14.0–49.7)
MCH: 32.3 pg (ref 25.1–34.0)
MCHC: 34 g/dL (ref 31.5–36.0)
MCV: 95.1 fL (ref 79.5–101.0)
MONO#: 0.9 10*3/uL (ref 0.1–0.9)
MONO%: 8.6 % (ref 0.0–14.0)
NEUT%: 50.9 % (ref 38.4–76.8)
NEUTROS ABS: 5.5 10*3/uL (ref 1.5–6.5)
NRBC: 0 % (ref 0–0)
Platelets: 169 10*3/uL (ref 145–400)
RBC: 4.46 10*6/uL (ref 3.70–5.45)
RDW: 13.5 % (ref 11.2–14.5)
WBC: 10.7 10*3/uL — AB (ref 3.9–10.3)

## 2016-03-13 NOTE — Progress Notes (Signed)
[facility]  Telephone:([phone] Fax:([phone]) [phone]  Clinic Follow up Note   Patient Care Team: Thayer Headings, MD as PCP - General (Internal Medicine) Thayer Headings, MD (Internal Medicine) 03/13/2016  CHIEF COMPLAIN: Follow up thrombocytopenia  HISTORY OF PRESENTING ILLNESS (12/17/2014):  Sherry Scott 55 y.o. female with past medical history of heavy alcohol drinker, alcohol-related dementia, on Aricept, depression, anxiety, is being referred by her PCP today for newly onset severe thrombocytopenia. I'm seeing her in [facility] emergency room.  According to the lab results from Dr. Maebelle Munroe office, she has had mild thrombocytopenia with platelet count 119K in 11/2012, the rest of CBC was normal. Repeated CBC showed a platelet count 128K in September 2015. She had a routine checkup with her primary care physician Dr. Thea Silversmith on 12/03/2014 and CBC showed plt 17K, repeated on 12/14/2014 and plt was 14K. W BC with differential, and hemoglobin were normal. CMP from 12/03/2014 were normal. She has no clinical signs of bleeding,  including hematochezia, melana, hemoptysis, hematuria or epitaxis. No mucosal bleeding or easy bruising. Dr. Thea Silversmith contacted me this morning, and I recommend her to come to [facility] emergency room. She is accompanied by her daughter.   She feels well in general, denies any significant pain, no recent episodes of fever, chill, diarrhea, or other illness. She denies any new prescription or over-the-counter medication. She has been having some skin rash on the bottom of her feet for the past year, and have some insect bite around her ankles lately, no other skin rashes. She lives alone, able to function at home. Her daughter checks on her frequently at home.  CURRENT THERAPY: observation   INTERVAL HISTORY: Sherry Scott returns for follow-up. She is accompanied by her daughter to the clinic today. I initially saw her last year, when she was  referred for thrombocytopenia. She lost to follow-up, and was referred back by her primary care physician Dr. Thea Silversmith. Her beta was 99K when she checked at Dr. Maebelle Munroe office 3 months ago. She denies any petechia, bleeding, or other symptoms. She feels well.  REVIEW OF SYSTEMS:   Constitutional: Denies fevers, chills or abnormal weight loss Eyes: Denies blurriness of vision Ears, nose, mouth, throat, and face: Denies mucositis or sore throat Respiratory: Denies cough, dyspnea or wheezes Cardiovascular: Denies palpitation, chest discomfort or lower extremity swelling Gastrointestinal:  Denies nausea, heartburn or change in bowel habits Skin: Denies abnormal skin rashes Lymphatics: Denies new lymphadenopathy or easy bruising Neurological:Denies numbness, tingling or new weaknesses Behavioral/Psych: Mood is stable, no new changes  All other systems were reviewed with the patient and are negative.  MEDICAL HISTORY:  Past Medical History:  Diagnosis Date  . 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 Anxiety   . Depression   . High triglycerides   . 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 BACK SURGERY  1996   herniated disc Lower back    I have reviewed the social history and family history with the patient and they are unchanged from previous note.  ALLERGIES:  has No Known Allergies.  MEDICATIONS:  Current Outpatient Prescriptions  Medication Sig Dispense Refill  . citalopram (CELEXA) 20 MG tablet TAKE 1 TABLET (20 MG TOTAL) BY MOUTH DAILY. 90 tablet 1  . donepezil (ARICEPT) 10 MG tablet TAKE 1 TABLET (10 MG TOTAL) BY MOUTH DAILY. 30 tablet 8  . LORazepam (ATIVAN) 0.5 MG tablet Take 0.5 mg by  mouth daily as needed for anxiety.    . memantine (NAMENDA) 10 MG tablet Take 1 tablet (10 mg total) by mouth 2 (two) times daily. 60 tablet 5  . Omega-3 Fatty Acids (FISH OIL PO)  Take 2 capsules by mouth at bedtime.    . TURMERIC PO Take 1 capsule by mouth at bedtime.     No current facility-administered medications for this visit.     PHYSICAL EXAMINATION: ECOG PERFORMANCE STATUS: 0 - Asymptomatic  Vitals:   03/13/16 1552  BP: 135/81  Pulse: 75  Resp: 18  Temp: 98.4 F (36.9 C)   Filed Weights   03/13/16 1552  Weight: 164 lb 12.8 oz (74.8 kg)    GENERAL:alert, no distress and comfortable SKIN: skin color, texture, turgor are normal, no rashes or significant lesions EYES: normal, Conjunctiva are pink and non-injected, sclera clear OROPHARYNX:no exudate, no erythema and lips, buccal mucosa, and tongue normal  NECK: supple, thyroid normal size, non-tender, without nodularity LYMPH:  no palpable lymphadenopathy in the cervical, axillary or inguinal LUNGS: clear to auscultation and percussion with normal breathing effort HEART: regular rate & rhythm and no murmurs and no lower extremity edema ABDOMEN:abdomen soft, non-tender and normal bowel sounds Musculoskeletal:no cyanosis of digits and no clubbing  NEURO: alert & oriented x 3 with fluent speech, no focal motor/sensory deficits  LABORATORY DATA:  I have reviewed the data as listed CBC Latest Ref Rng & Units 03/13/2016 12/17/2014 12/17/2014  WBC 3.9 - 10.3 10e3/uL 10.7(H) - 8.4  Hemoglobin 11.6 - 15.9 g/dL 40.914.4 - 81.114.4  Hematocrit 34.8 - 46.6 % 42.4 - 42.8  Platelets 145 - 400 10e3/uL 169 few large plts. 114(L) 113(L)     CMP Latest Ref Rng & Units 03/13/2016 12/17/2014  Glucose 70 - 140 mg/dl 914100 782(N120(H)  BUN 7.0 - 56.226.0 mg/dL 13.014.8 12  Creatinine 0.6 - 1.1 mg/dL 0.8 8.650.95  Sodium 784136 - 145 mEq/L 143 144  Potassium 3.5 - 5.1 mEq/L 4.4 3.9  Chloride 101 - 111 mmol/L - 105  CO2 22 - 29 mEq/L 29 30  Calcium 8.4 - 10.4 mg/dL 9.6 9.6  Total Protein 6.4 - 8.3 g/dL 6.9 7.4  Total Bilirubin 0.20 - 1.20 mg/dL 6.960.47 1.2  Alkaline Phos 40 - 150 U/L 101 67  AST 5 - 34 U/L 22 22  ALT 0 - 55 U/L 30 21       RADIOGRAPHIC STUDIES: I have personally reviewed the radiological images as listed and agreed with the findings in the report. No results found.   ASSESSMENT & PLAN:  55 year old Caucasian female, with past medical history of alcohol abuse, Wernicke's dementia, presented with incidental finding of Thrombocytopenia with platelet count  in the range of 119-136 in the past two years.  1. 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 -The other possibility is chronic alcohol related liver disease and spleenmegaly. However her CMP showed normal liver function, so this is less likely. She has quit alcohol drinking many years ago. -Her other lab work showed no evidence of DIC or hemolysis -Her platelet counts is normal 169,000 today. She is clinically doing well, no bleeding. -We discussed the indication for treatment of ITP, such as severe thrombocytopenia with plt <20-30K or clinical bleeding  -I recommend her to continue monitoring CBC every 3-6 months at her PCP's office, and I will see her in one year,  Sooner if needed. -It seems her plt test results at her primary care physician's office is lower than here, her daughter works for [employer], she will request her blood sample to be sent to lab [facility] for testing.  All questions were answered. The patient knows to call the clinic with any problems, questions or concerns. No barriers to learning was detected.  I spent 20 minutes counseling the patient face to face. The total time spent in the appointment was 25 minutes and more than 50% was on counseling and review of test results     Malachy MoodFeng, Keeshia Sanderlin, MD 03/13/16

## 2016-03-13 NOTE — Telephone Encounter (Signed)
Appointments scheduled per 03/13/16 los. AVS report and appointment schedule given to patient per 03/13/16 los. °

## 2016-03-15 ENCOUNTER — Encounter: Payer: Self-pay | Admitting: Hematology

## 2016-05-01 ENCOUNTER — Other Ambulatory Visit: Payer: Self-pay | Admitting: Neurology

## 2016-05-18 ENCOUNTER — Telehealth: Payer: Self-pay | Admitting: Hematology

## 2016-05-18 NOTE — Telephone Encounter (Signed)
Per desk nurse who sent 1/19 message proceed with message sent for this patient on 1/19 re schedule lab in February. Per desk nurse call patient dtr with appointment.   Left message for dtr Sherry Scott re 2/7 lab. Schedule mailed.

## 2016-05-18 NOTE — Telephone Encounter (Signed)
Regarding 1/19 schedule message.  ° °Received two similar schedule messages on patients with the same last name from the same desk nurse re scheduling lab. Not clear in wording of message if lab is for patient or patient's daughter and if this is genetics related.  ° °Message to desk to clarify messages sent re the two YF patients with the same last name. 05/15/16. °

## 2016-06-02 ENCOUNTER — Other Ambulatory Visit: Payer: Self-pay | Admitting: *Deleted

## 2016-06-02 MED ORDER — MEMANTINE HCL 10 MG PO TABS: 10.0000 mg | ORAL_TABLET | Freq: Two times a day (BID) | ORAL | 1 refills | Status: DC

## 2016-06-03 ENCOUNTER — Other Ambulatory Visit: Payer: BLUE CROSS/BLUE SHIELD

## 2016-07-13 ENCOUNTER — Telehealth: Payer: Self-pay | Admitting: Neurology

## 2016-07-13 NOTE — Telephone Encounter (Signed)
Pt daughter called asking for a call back from nurse due to questions she has re: her mothers diagnosis and future care due to her handling the  Arthor Captainstate of her mother

## 2016-07-13 NOTE — Telephone Encounter (Signed)
Spoke to daughter, Orpha BurKaty.  She is asking advice, relating to worse case scenario about plan for her mother.  What is possible timeline for pts diagnosis.  When would she require more care?  Has had to increase HHA for pt including medication management.  (times when meds to be given).  She is also wanting to know what to ask?  I told her that each pt is different and time line.  Sending question via mychart would be appropriate so as provider would have questions prior to pt s appt and address when here for appt.  Please advise.

## 2016-07-14 NOTE — Telephone Encounter (Signed)
She needs to write down specfic concerns to be addressed at next  visit.

## 2016-07-16 ENCOUNTER — Ambulatory Visit: Payer: Self-pay | Admitting: Nurse Practitioner

## 2016-07-16 NOTE — Telephone Encounter (Signed)
Spoke with daughter, Orpha BurKaty.  Relayed per CM/NP to write down questions and address at next visit.  She is wanting to come and for consultation without her mother and discuss questions she has.   She feels like that is not enough time to do this at appt, pt gets agitated if she asks too many questions.  She would like a face to face consultation.

## 2016-07-16 NOTE — Telephone Encounter (Signed)
That can be set up With Dr. Vickey Hugerohmeier. I already talked to [place].What she needs is SW

## 2016-07-20 NOTE — Telephone Encounter (Signed)
I will be happy to speak to her ahead of the appointment, can she come over lunch time ? Monday , 4-9 or Tuesday the 08-04-2016?

## 2016-07-20 NOTE — Telephone Encounter (Signed)
I called pt's daughter, Sherry Scott, [facility]. I offered her either 08/03/2016 or 08/04/2016 to meet with Dr. Vickey Hugerohmeier at lunch. Pt's daughter will call me back when she looks at her calendar.

## 2016-07-20 NOTE — Telephone Encounter (Signed)
I spoke with Andrey CampanileSandy, Charity fundraiserN. This pt's daughter wishes to speak with Dr. Vickey Hugerohmeier without the pt outside of pt's appt on 08/05/2016. (Pt gets agitated when the daughter asks too many questions in the office visit.) Pt has an appt on 08/05/2016.  Dr. Vickey Hugerohmeier, please let us know if you are agreeable to this and let us know of a time this will work for you.

## 2016-07-21 NOTE — Telephone Encounter (Signed)
I called pt's daughter, Purvis Kilts, per [facility]. She needs to cancel the 08/05/2016 appt with Dr. Vickey Huger because daughter will be out of town. Pt's daughter has a difficult schedule. I offered her an appt tomorrow, but pt's daughter cannot make that appt. I then offered her an appt on 09/29/2016 but pt's daughter says that the pt is declining and does not want to wait that long.  All of April (including work in visits) are full. We also still need to schedule the separate daughter conference. I will have to discuss with Dr. Vickey Huger further.

## 2016-07-22 NOTE — Telephone Encounter (Signed)
I spoke to Dr. Vickey Hugerohmeier. She is agreeable to working the pt in at 3:30pm one day in May that suits pt's daughter's schedule, with a conference with the daughter on the same day during lunch.   I called pt's daughter, Purvis KiltsMary Katherine, to discuss. No answer, left a message asking her to call me back.

## 2016-07-23 NOTE — Telephone Encounter (Signed)
Pt's daughter returned my call. She is agreeable to a conference with Dr. Vickey Hugerohmeier on 08/11/16 at 12 noon and the pt will be scheduled at 3:30pm for the same day. Pt's daughter verbalized understanding of these appts.

## 2016-07-30 ENCOUNTER — Other Ambulatory Visit: Payer: Self-pay | Admitting: Nurse Practitioner

## 2016-08-05 ENCOUNTER — Ambulatory Visit: Payer: Self-pay | Admitting: Neurology

## 2016-08-11 ENCOUNTER — Telehealth: Payer: Self-pay | Admitting: Neurology

## 2016-08-11 ENCOUNTER — Ambulatory Visit: Payer: Self-pay | Admitting: Neurology

## 2016-08-11 NOTE — Telephone Encounter (Signed)
Patient's daughter would like a call back regarding changing providers to one who has more flexibility and not such a tight schedule. Would not resch today's apt [phone]

## 2016-08-12 ENCOUNTER — Encounter: Payer: Self-pay | Admitting: Neurology

## 2016-08-12 NOTE — Telephone Encounter (Signed)
These are questions the daughter send via MyChart to discuss with physician before appt with patient.      From: Ralene Muskrat. Kassem    Sent: 08/12/2016 10:26 AM EDT      To: Melvyn Novas, MD Subject: Non-Urgent Medical Question  Questions for MD during consult with caregiver:  From a few studies I've read about WKS, patients have gained full function or at least maintained at the level of mental capacity at diagnosis with sobriety, why isn't it the case with Angelique Blonder?  When in her decline would you suggest more care?  We have an in home aid for a few hours a day at this time.   Why should she maintain the medications currently prescribed if she continues to decline?  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  What are our next steps in medical treatment?  What other options do we have?  How will this progress? Is there a timeline? Is this diagnosis fatal? Are there stages like found in Alzheimer's?  She was denied Disability, what documents can your office supply to support an appeal?  What other resources are available for support and information?  What haven't I thought of that I need to know?   What other tests can confirm or help with diagnosis? Genetic testing?   Were the results of the MRI inconclusive in supporting a diagnosis?

## 2016-08-12 NOTE — Telephone Encounter (Signed)
I called daughter back and r/s patient for 5/21. Unable to make sooner appt due to Dr. Vickey Huger being out and several schedule restrictions from daughter's work.   Coralee Rud (patient's daughter) is asking if there is another doctor in our facility that has more schedule flexibility that the patient can switch too? Or asks if there is another facility she can go too? Patient was r/s due to bad weather. I worked her into the May schedule but there were several days that the daughter could not bring the patient in due to work. Daughter also requests a separate conference via face to face or phone to discuss her mother's condition without the patient being present.  I advised her that I will bring this to your attention and we will call her back with any suggestions. Patient still has appt in May.

## 2016-09-11 ENCOUNTER — Encounter: Payer: Self-pay | Admitting: Neurology

## 2016-09-14 ENCOUNTER — Telehealth: Payer: Self-pay | Admitting: Neurology

## 2016-09-14 ENCOUNTER — Encounter: Payer: Self-pay | Admitting: Neurology

## 2016-09-14 ENCOUNTER — Ambulatory Visit (INDEPENDENT_AMBULATORY_CARE_PROVIDER_SITE_OTHER): Payer: BLUE CROSS/BLUE SHIELD | Admitting: Neurology

## 2016-09-14 VITALS — BP 121/81 | HR 71 | Ht 68.0 in | Wt 171.0 lb

## 2016-09-14 DIAGNOSIS — R4701 Aphasia: Secondary | ICD-10-CM

## 2016-09-14 DIAGNOSIS — F04 Amnestic disorder due to known physiological condition: Secondary | ICD-10-CM | POA: Diagnosis not present

## 2016-09-14 DIAGNOSIS — R41841 Cognitive communication deficit: Secondary | ICD-10-CM

## 2016-09-14 DIAGNOSIS — R270 Ataxia, unspecified: Secondary | ICD-10-CM | POA: Diagnosis not present

## 2016-09-14 NOTE — Patient Instructions (Signed)
Wernicke-Korsakoff Syndrome Wernicke-Korsakoff syndrome (WKS) is a brain disorder that is characterized by memory loss and a serious mental condition that impairs thinking and emotions (psychosis). The term actually refers to two conditions that often occur together. Wernicke encephalopathy happens first. This disease damages the hypothalamus and thalamus portions of the brain. As the disease gets worse, Wernicke encephalopathy is followed by Korsakoff syndrome (Korsakoff psychosis). This condition can permanently damage the areas of the brain that are responsible for memory. What are the causes? This condition is caused by a lack of vitamin B1 (thiamine). This deficiency can result from:  Alcoholism.  Poor nutrition.  Certain medical conditions. What increases the risk? This condition is more likely to develop in:  People who have had severe alcoholism for 5 years or longer.  People who have poor nutrition.  People who have AIDS (acquired immunodeficiency syndrome).  People who have a history of chronic infections.  People who have anorexia nervosa.  People who receive kidney dialysis.  People who have advanced cancer. What are the signs or symptoms? Symptoms vary based on what stage of the disease you have. Symptoms of Wernicke encephalopathy include:  Uncoordinated muscles.  Abnormal eye movements.  Confusion.  Loss of other mental abilities.  Double vision.  Eyelid drooping.  Symptoms of alcohol withdrawal. As the disease gets worse, symptoms of Korsakoff syndrome may develop. These include:  Severe memory loss.  Inability to form new memories.  Hallucinations.  Tendency to make up stories. How is this diagnosed? There is no specific test to diagnose this condition. It may be diagnosed based on:  Your symptoms and medical history. Your health care provider may suspect this condition if you abuse alcohol.  A physical exam.  Blood tests to check your  thiamine level and to look for other signs of malnutrition.  Tests to check for memory loss.  Imaging studies to look for changes to the brain and other body parts. These may include MRI and CT scans. How is this treated? Treatment for this condition needs to start early. If the condition is diagnosed and treated in the early stages, it can be reversed. If the condition is left untreated, it can cause permanent brain damage. Treatment may include:  Thiamine replacement therapy. You may be given thiamine through an IV tube or by mouth.  Treatment for alcoholism.  Medicine and mental health counseling for brain-related symptoms of chronic dementia. Follow these instructions at home:  Take medicines only as told by your health care provider.  Do not drink alcohol. Get treatment for alcoholism if needed.  Eat a nutritious diet.  Keep all follow-up visits as told by your health care provider. This is important, especially for mental health counseling.  Get caregiver support if necessary. Contact a health care provider if:  You have confusion or memory problems.  You have severe alcoholism. Get help right away if:  Your symptoms become severe.  You have serious thoughts about hurting yourself or others. This information is not intended to replace advice given to you by your health care provider. Make sure you discuss any questions you have with your health care provider. Document Released: 04/03/2002 Document Revised: 09/19/2015 Document Reviewed: 12/27/2013 Elsevier Interactive Patient Education  2017 [employer]

## 2016-09-14 NOTE — Progress Notes (Signed)
[facility]  PATIENT: Sherry Scott DOB: Sep 03, 1960   REASON FOR VISIT: Follow-up for dementia associated with alcoholism HISTORY FROM: Patient and daughter    HISTORY OF PRESENT ILLNESS:UPDATE    I have the pleasure to see Sherry Scott and her daughter today on 09/14/2016, The patient has followed in our office on roughly a every 6 month basis. Initially she was seen in a consultation on 07/25/2014 and at the time had just become sober after a long period of alcohol use and abuse. She took multivitamins and minerals established at healthy diet and stay physically active. Over the next 8 months her cognition improved, a finding not uncommon in cognitive disorders related to alcohol abuse. She continues to live by herself but has a daily caregiver for several hours a day, she cares for her 2 dogs. She has over the last 6 -8 month declined again. Her word finding has been slowed, she gets stuck mid sentence , looking for words.  She is ataxic and she has handwriting changes. She denies loss of smell or taste. She eats more candy than ever.  This has progressed. She has never regained the ability to  Balance her finances, and now is unable to look after her own medication. She got lost and cannot longer driver, a fighter with her daughter ensued. Her caretaker now brings her to the places she shops at.  We are meeting to discuss if she still is most likely to suffer from Wernicke Korsakoff or if another dementia has been mixed in.  Her MMSE was 11/30 today      10/13/2017CM Sherry Scott, 56 year old female returns for follow-up with her daughter. She has a history of memory loss most likely related to her previous excessive alcohol use. She is now sober. She is currently on Aricept without side effects however she continues to have fluctuations in her memory on a day-to-day basis. She has someone that comes in Monday through Saturday 4 hours to assist with errands shopping  etc. Daughter checks on her frequently as well. She claims she is sleeping well at night. She has been found to have low platelet count and is due to see hematology. She returns for reevaluation    UPDATE 07/20/2017CM Sherry Scott, 56 year old female returns for follow-up. She has a history of dementia most likely related to her previous alcohol use. She is now sober. She is currently on Aricept without side effects. She feels this is helping her memory. She is also on Celexa. Appetite is good and she has gained weight since last seen. Sleeping well at night. Has a garden this year. Daughter checks on her frequently. Daughter says she has good days and bad days. She returns for reevaluation    UPDATE 03/14/15 CM Sherry Scott, 56 year old female returns for follow-up. She was initially evaluated by Dr. Vickey Huger 07/25/2014. She has a history of dementia most likely related to her alcohol use. She is now sober and seeing a substance abuse counselor. EEG with mild background slowing no evidence of seizure activity. MRI shows small vessel disease. She was placed on low dose Aricept by Dr. Anne Hahn in Dr. Oliva Bustard absence. She feels this has helped and she denies any side effects to the medication. She is also on Celexa 20 mg daily and B complex prenatal vitamins. Appetite is good and she is sleeping well. She returns for reevaluation    07/25/14 CD Sherry Scott is referred today for evaluation of memory loss or dementia by Dr.  Thea SilversmithMackenzie , her primary care physician.  The patient has been endorsing a past medical history of depression and anxiety and she had a back surgery in 1996, she is a current smoker endorsed some blurred vision memory loss and confusional spells as well as still some anxiety in her review of systems.  She stated that the prescribed alprazolam is not what she likes to use- and that it doesn't make her feel very good. She is on an SSRI, which is citalopram 10 mg daily. The patient believes  that she has more seasonal depression and that winter is a gloomy time for her. About one year ago, in March 2015 ,the patient first noticed memory difficulties. This alone also she is accompanied today by her daughter. Dr. Ronne BinningMcKenzie brought on 07-03-14 that the patient recently got lost in her own neighborhood, a fact she has already forgotten. She lives 25 years in the same area. She used to be a heavy drinker, still smokes. She forgot meals. She has good and bad days, she is remarkably unconcerned and her daughter had to call the maternal aunt to get someone to confront and change her mother. She has been sober for only 3 weeks. She has difficulties for over 2-3 years according to the daughter. Her mother lives with 4 dogs.   [name], Sherry Scott, came to assist in the task about 3 weeks ago. The patient had difficulties with paying the bills, and she had also not filed her taxes, not paid bills and she had not renewed tags on her car, expired last January. The patient is at this time not gainfully employed, she does keep her own house and she also long etc. she takes care of her 4 dogs, at this time unassisted. Her daughter feels the house is not kept clean.  The patient has all her daily routines cycle around the need of the animals. She has not worked out side the home. 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   The patient has begun to take B complex vitamins. I reviewed today's Mini-Mental Status Examination with Sherry Scott, a 21 out of 30 points. The main point loss was in the 3 words of recall, but in the visual spatial orientation she was not able to draw clock face today. She was able to copy an image of 5 pentagons overlapping, and she generated 8 animal names. She mistook the months of the year for April, stated that it approached summer but could not find the word for spring, and was not sure about the date of course. Attention and calculation were attempted but could not be  performed and she scored only 2 out of 5 points in spelling backwards.   REVIEW OF SYSTEMS: Full 14 system review of systems performed and notable only for those listed, all others are neg:  Constitutional: Fatigue, twitsed her ankle - fracture on the right ankle- felt no pain , handwriting changes, ataxia, aphasia.    ALLERGIES: No Known Allergies  HOME MEDICATIONS: Outpatient Medications Prior to Visit  Medication Sig Dispense Refill  . cholecalciferol (VITAMIN D) 1000 units tablet Take 1,000 Units by mouth daily.    . citalopram (CELEXA) 20 MG tablet TAKE 1 TABLET (20 MG TOTAL) BY MOUTH DAILY. 90 tablet 1  . donepezil (ARICEPT) 10 MG tablet TAKE 1 TABLET (10 MG TOTAL) BY MOUTH DAILY. 30 tablet 2  . memantine (NAMENDA) 10 MG tablet Take 1 tablet (10 mg total) by mouth 2 (two) times daily. 180  tablet 1  . Omega-3 Fatty Acids (FISH OIL PO) Take 2 capsules by mouth at bedtime.    . TURMERIC PO Take 1 capsule by mouth at bedtime.     No facility-administered medications prior to visit.     PAST MEDICAL HISTORY: Past Medical History:  Diagnosis Date  . 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 Anxiety   . Depression   . High triglycerides   . 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 BACK SURGERY  1996   herniated disc Lower back    FAMILY HISTORY: Family History  Problem Relation Age of Onset  . Breast cancer Mother   . Lung cancer Father     SOCIAL HISTORY: Social History   Social History  . Marital status: Single    Spouse name: N/A  . Number of children: 1  . Years of education: 2y coll   Occupational History  . retired    Social History Main Topics  . Smoking status: Current Every Day Smoker    Packs/day: 0.50    Types: Cigarettes  . Smokeless tobacco: Never Used  . Alcohol use No     Comment: 3wks sober   . Drug use: No  . Sexual activity: Not  on file   Other Topics Concern  . Not on file   Social History Narrative   Caffeine 5 cups coffee daily (weak).   Lives with     PHYSICAL EXAM  Vitals:   09/14/16 1301  BP: 121/81  Pulse: 71  Weight: 171 lb (77.6 kg)  Height: 5\' 8"  (1.727 m)   Body mass index is 26 kg/m.The patient is awake and alert, but not longer oriented to place and time.   MMSE - Mini Mental State Exam 09/14/2016 02/07/2016 11/14/2015  Orientation to time 0 3 4  Orientation to Place 3 3 2   Registration 2 3 3   Attention/ Calculation 0 0 1  Recall 1 0 0  Language- name 2 objects 2 2 2   Language- repeat 0 1 1  Language- follow 3 step command 2 3 3   Language- read & follow direction 1 1 1   Write a sentence 0 1 1  Copy design 0 0 1  Total score 11 17 19     General: The patient is awake, alert and appears not in acute distress. The patient is well groomed. Head: Normocephalic, atraumatic. Neck is supple. Mallampati 2, neck circumference:13.5  Cardiovascular:  Regular rate and rhythm , without  murmurs or carotid bruit, and without distended neck veins. Respiratory: Lungs are clear to auscultation. Skin:  Without evidence of edema, or rash Trunk: BMI is normal - elevated and patient  has normal posture.   Neurologic exam : The patient is awake and alert, oriented to place and time.  Memory subjective described as impaired with very limitedl attention span & concentration ability. Speech is non-fluent without  dysarthria, there is  aphasia. Mood and affect are not depressed, rather giggly.  Cranial nerves: Pupils are equal and briskly reactive to light.  Funduscopic exam without evidence of pallor or edema.  Extraocular movements in vertical and horizontal planes are not smooth , but show coarse saccades- without nystagmus. Visual fields by finger perimetry are intact. Hearing to finger rub intact.  Facial sensation intact to fine touch. Facial motor strength is symmetric and tongue and uvula move  midline.  Motor exam: Normal tone and normal muscle  bulk and symmetric normal strength in all extremities.  Sensory:  Fine touch, pinprick and vibration were tested in all extremities.  The vibration and sensation of fine touch is decreased in both feet. Proprioception was normal.  Coordination: Finger-to-nose maneuver tested and normal without evidence of ataxia, only  mild  Dysmetria,  not  tremor.  Gait and station: Patient walks without assistive device and is able and assisted stool climb up to the exam table. Strength within normal limits. Stance is stable and normal. Tandem gait is impaired but the wide based stance and gait is resolved !  Steps are unfragmented. She walks slightly stooped. Romberg testing is normal.  Deep tendon reflexes: in the  upper and lower extremities are symmetric and intact. Babinski maneuver response is downgoing.   Assessment:  After physical and neurologic examination, review of laboratory studies, imaging, neurophysiology testing and pre-existing records, assessment will be reviewed on the problem list.   DIAGNOSTIC DATA (LABS, IMAGING, TESTING)  Sherry Scott's last brain MRI is 23 months old at this time and showed only white matter disease which was thought to be microvascular in origin. It did not show at the time of significant focal atrophy that would have helped [place] in the differentiation of a cognitive disorder. There was especially no mentioning of a cerebellar atrophy. For this reason I will reorder an MRI of the brain to see how we can qualify her cognitive disorder with the image. In addition I will repeat a complete metabolic panel, CBC with differential, and I would like the patient to be tested by Dr. Alinda Dooms heath, PhD in neuropsychology. Sherry Scott reports not having had any suspicion of ADHD or ADD, but she now has some significant attention problems but it comes to multitasking it is also possible or multistep, months. In our exam  We had  to go single step by step. Sherry Scott's daughter agrees with my plan.    ASSESSMENT AND PLAN 56 y.o. year old female has a past medical history of (likely) Alcoholism associated with dementia (HCC) (07/25/2014); and wernicke - Korsakoff syndrome , recent painless ankle injury,  (HCC) (07/25/2014). here to follow-up.    Continue Aricept at current dose Continue Celexa at current dose  Namenda  10 mg twice daily Dr Alinda Dooms heath for testing.  Repeat MRI brain . Continue to exercise for overall health and well-being, and stay on healthy diet with whole grains  fresh fruits and vegetables Follow-up in 6 months next visit with Dr. West Carbo, MD   [facility] [address] [place], [place] [address] [phone]

## 2016-09-14 NOTE — Telephone Encounter (Signed)
pateint declined in he last 8 month. Has daily caretaker, not allowed to drive. Loses words- looks as if she is inatentive, can onl one step commands at a time.  patient remained sober for 24 month.  No recent brain  MRI.  No use of lorazepam . Ataxia- very poorly coordinated- handwriting hanged.   MRI, metabolic panel. 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 Referral to dr Alinda DoomsBailar. CD

## 2016-09-15 LAB — COMPREHENSIVE METABOLIC PANEL
ALK PHOS: 97 IU/L (ref 39–117)
ALT: 19 IU/L (ref 0–32)
AST: 16 IU/L (ref 0–40)
Albumin/Globulin Ratio: 1.9 (ref 1.2–2.2)
Albumin: 4.6 g/dL (ref 3.5–5.5)
BUN/Creatinine Ratio: 20 (ref 9–23)
BUN: 16 mg/dL (ref 6–24)
Bilirubin Total: 0.6 mg/dL (ref 0.0–1.2)
CO2: 27 mmol/L (ref 18–29)
Calcium: 9.8 mg/dL (ref 8.7–10.2)
Chloride: 101 mmol/L (ref 96–106)
Creatinine, Ser: 0.79 mg/dL (ref 0.57–1.00)
GFR calc non Af Amer: 83 mL/min/{1.73_m2} (ref >59)
GFR, EST AFRICAN AMERICAN: 95 mL/min/{1.73_m2} (ref >59)
GLUCOSE: 94 mg/dL (ref 65–99)
Globulin, Total: 2.4 g/dL (ref 1.5–4.5)
Potassium: 5.1 mmol/L (ref 3.5–5.2)
Sodium: 141 mmol/L (ref 134–144)
TOTAL PROTEIN: 7 g/dL (ref 6.0–8.5)

## 2016-09-15 LAB — CBC WITH DIFFERENTIAL/PLATELET
Basophils Absolute: 0 10*3/uL (ref 0.0–0.2)
Basos: 0 %
EOS (ABSOLUTE): 0.2 10*3/uL (ref 0.0–0.4)
Eos: 2 %
Hematocrit: 43.6 % (ref 34.0–46.6)
Hemoglobin: 14.9 g/dL (ref 11.1–15.9)
Immature Grans (Abs): 0 10*3/uL (ref 0.0–0.1)
Immature Granulocytes: 0 %
LYMPHS ABS: 4.1 10*3/uL — AB (ref 0.7–3.1)
Lymphs: 39 %
MCH: 32 pg (ref 26.6–33.0)
MCHC: 34.2 g/dL (ref 31.5–35.7)
MCV: 94 fL (ref 79–97)
MONOS ABS: 0.8 10*3/uL (ref 0.1–0.9)
Monocytes: 7 %
NEUTROS PCT: 52 %
Neutrophils Absolute: 5.7 10*3/uL (ref 1.4–7.0)
PLATELETS: 103 10*3/uL — AB (ref 150–379)
RBC: 4.66 x10E6/uL (ref 3.77–5.28)
RDW: 13.4 % (ref 12.3–15.4)
WBC: 10.8 10*3/uL (ref 3.4–10.8)

## 2016-09-16 ENCOUNTER — Telehealth: Payer: Self-pay

## 2016-09-16 NOTE — Telephone Encounter (Signed)
-----   Message from Melvyn Novasarmen Dohmeier, MD sent at 09/15/2016  5:17 PM EDT ----- Very normal metabolic panel. Due to low platelet count I recommend not to take ASA daily, if you take ASA , take it every other day

## 2016-09-16 NOTE — Telephone Encounter (Signed)
I called, spoke to [facility], pt's daughter, per DPR. I advised her that pt's labs revealed a very normal metabolic panel except for a low platelet count. Pt's daughter reports that pt does not take aspirin. Pt's daughter verbalized understanding of results and does not have any questions or concerns at this time.

## 2016-09-23 ENCOUNTER — Other Ambulatory Visit: Payer: BLUE CROSS/BLUE SHIELD

## 2016-09-30 ENCOUNTER — Ambulatory Visit (INDEPENDENT_AMBULATORY_CARE_PROVIDER_SITE_OTHER): Payer: BLUE CROSS/BLUE SHIELD

## 2016-09-30 DIAGNOSIS — F04 Amnestic disorder due to known physiological condition: Secondary | ICD-10-CM | POA: Diagnosis not present

## 2016-09-30 DIAGNOSIS — R270 Ataxia, unspecified: Secondary | ICD-10-CM

## 2016-09-30 DIAGNOSIS — R4701 Aphasia: Secondary | ICD-10-CM

## 2016-09-30 MED ORDER — GADOPENTETATE DIMEGLUMINE 469.01 MG/ML IV SOLN: 15.0000 mL | Freq: Once | INTRAVENOUS | Status: AC | PRN

## 2016-10-02 ENCOUNTER — Telehealth: Payer: Self-pay | Admitting: Neurology

## 2016-10-02 NOTE — Telephone Encounter (Signed)
Pt's daughter said she called the psych office the pt was referred to and they only see children. Where will pt be referred to now. Please call next week

## 2016-10-06 NOTE — Telephone Encounter (Signed)
[facility] .

## 2016-10-07 ENCOUNTER — Telehealth: Payer: Self-pay | Admitting: *Deleted

## 2016-10-07 NOTE — Telephone Encounter (Signed)
I have spoken with pt's dtr. Sherry Scott and per SA, reviewed below MRI results with her.  She verbalized understanding of same/fim

## 2016-10-07 NOTE — Telephone Encounter (Signed)
-----   Message from Anson FretAntonia B Ahern, MD sent at 10/06/2016  5:41 PM EDT ----- The MRI shows white matter changes that can be seen normally in aging however things like alcohol use, snoking, cholesterol, HTN, diabetes can worsen these changes but hers are not concerning for any other process. She has some atrophy which again can be seen in normal aging but also with chronic alcohol as it appears patient has a history of. No strokes or masses or lesions.

## 2016-10-31 ENCOUNTER — Other Ambulatory Visit: Payer: Self-pay | Admitting: Neurology

## 2016-10-31 ENCOUNTER — Other Ambulatory Visit: Payer: Self-pay | Admitting: Nurse Practitioner

## 2016-11-02 ENCOUNTER — Other Ambulatory Visit: Payer: Self-pay | Admitting: Nurse Practitioner

## 2016-11-15 ENCOUNTER — Encounter: Payer: Self-pay | Admitting: Neurology

## 2016-11-18 ENCOUNTER — Other Ambulatory Visit: Payer: Self-pay | Admitting: Neurology

## 2016-11-23 ENCOUNTER — Telehealth: Payer: Self-pay | Admitting: Neurology

## 2016-11-23 NOTE — Telephone Encounter (Signed)
Patient's daughter is calling to discuss neuro psych referral. Also she would like to discuss estate planning and has questions.

## 2016-11-24 NOTE — Telephone Encounter (Signed)
I spoke to daughter. Dr.Bailiar first Betsey HolidaySlot is in October. I will send referral to [place] [place] [facility] [place] [facility]. Daughter is Willing to Travel there. Daughter has telephone and details. Patient's daughter will call if she needs to .  Dr. Jacquelyne BalintMcDermott telephone [phone] - fax [phone].

## 2016-11-24 NOTE — Telephone Encounter (Signed)
Called pt's daughter back to see if I could help answer her questions. They are trying to address some estate planning and since pt is too young for medicare and disability they are going to a lawyer to discuss everything. The patients daughter stated there were two questions that the lawyers office asked that she need Dr Dohmeier's opinions or answer on. 1) At what competency would you consider the pt at? 2) What's the life expectancy of pt with her condition? I told her I would make the Dr aware of these questions and see what answers she had to offer. In the meantime,Pt daughter has reached out multiple times in regards to the pt was suppose to be referred to a neuro psychologist. The referrals department has been working on this order and so I told her I would transfer her to speak with Darreld Mcleanana Cox who will help inform her of this.  Pt daughter was very Adult nurseappreciative

## 2016-11-25 ENCOUNTER — Other Ambulatory Visit: Payer: Self-pay | Admitting: Nurse Practitioner

## 2016-12-03 ENCOUNTER — Other Ambulatory Visit: Payer: Self-pay | Admitting: Neurology

## 2016-12-03 ENCOUNTER — Encounter: Payer: Self-pay | Admitting: Neurology

## 2016-12-03 NOTE — Telephone Encounter (Signed)
Sent a Mychart message in response answering the questions that the daughter had. unfortunately Dr Vickey Hugerohmeier was unable to answer the questions that were asked below. She states that the neuro psych doctor will have to assess competency and she doesn't have an answer for the life expectancy. Per mychart message the patient daughter still hasn't heard anything in order to get the patient scheduled with neuro psych consult. I informed the patient that Darreld McleanDana Cox is working hard on trying to get that taken care of.

## 2016-12-08 NOTE — Telephone Encounter (Signed)
Called and Followed up with Patient's daughter . And her mother has an apt. With Dr. Jacquelyne BalintMcDermott.

## 2016-12-17 ENCOUNTER — Encounter: Payer: Self-pay | Admitting: Neurology

## 2016-12-29 ENCOUNTER — Encounter: Payer: Self-pay | Admitting: Neurology

## 2016-12-29 ENCOUNTER — Ambulatory Visit (INDEPENDENT_AMBULATORY_CARE_PROVIDER_SITE_OTHER): Payer: BLUE CROSS/BLUE SHIELD | Admitting: Neurology

## 2016-12-29 VITALS — BP 128/74 | HR 66 | Ht 68.0 in | Wt 169.0 lb

## 2016-12-29 DIAGNOSIS — F1027 Alcohol dependence with alcohol-induced persisting dementia: Secondary | ICD-10-CM | POA: Diagnosis not present

## 2016-12-29 NOTE — Progress Notes (Signed)
[facility]  PATIENT: LAKENZIE MCCLAFFERTY DOB: 1960-07-21   REASON FOR VISIT: Follow-up for dementia associated with alcoholism HISTORY FROM: Patient and daughter    HISTORY OF PRESENT ILLNESS:UPDATE :  Today is 12/29/2016, and has the pleasure of seeing Mrs. Adelson and her daughter, who became last year her power of attorney. Patient and daughter would like to perform a trust to balance her finances, and in order to allow her to stay at home she will need a caretaker. They have planned to enroll Mrs. Pyle in the  Day care program [facility]. Her review of symptoms today stated that the patient is still smoking but does not longer drink alcohol with has been the case for at least 8 month, she has some speech difficulties sometimes word finding problems, memory loss with amnestic periods, confusion in daytime sleepiness. There has been no hospitalizations since our last visit. Today's Mini-Mental Status Examination was scored at 13 out of 30 points, similar to the last 2 previous tests. She is not competent to run her own affairs.  She had a new MRI as of June 2018, but the Images were not compared to the previous films , obtained at the same location.     I have the pleasure to see Mrs. Bryden and her daughter today on 09/14/2016, The patient has followed in our office on roughly a every 6 month basis. Initially she was seen in a consultation on 07/25/2014 and at the time had just become sober after a long period of alcohol use and abuse. She took multivitamins and minerals established at healthy diet and stay physically active. Over the next 8 months her cognition improved, a finding not uncommon in cognitive disorders related to alcohol abuse. She continues to live by herself but has a daily caregiver for several hours a day, she cares for her 2 dogs. She has over the last 6 -8 month declined again. Her word finding has been slowed, she gets stuck mid sentence , looking for  words.  She is ataxic and she has handwriting changes. She denies loss of smell or taste. She eats more candy than ever.  This has progressed. She has never regained the ability to  balance her finances, and now is unable to look after her own medication. She got lost and cannot longer driver, a fighter with her daughter ensued. Her caretaker now brings her to the places she shops at.  We are meeting to discuss if she still is most likely to suffer from Wernicke Korsakoff or if another dementia has been mixed in.  Her MMSE was 11/30 today      10/13/2017CM Ms. Decaprio, 56 year old female returns for follow-up with her daughter. She has a history of memory loss most likely related to her previous excessive alcohol use. She is now sober. She is currently on Aricept without side effects however she continues to have fluctuations in her memory on a day-to-day basis. She has someone that comes in Monday through Saturday 4 hours to assist with errands shopping etc. Daughter checks on her frequently as well. She claims she is sleeping well at night. She has been found to have low platelet count and is due to see hematology. She returns for reevaluation     UPDATE 03/14/15 CM Ms. Maina, 56 year old female returns for follow-up. She was initially evaluated by Dr. Vickey Huger 07/25/2014. She has a history of dementia most likely related to her alcohol use. She is now sober and seeing a substance  abuse counselor. EEG with mild background slowing no evidence of seizure activity. MRI shows small vessel disease. She was placed on low dose Aricept by Dr. Anne Hahn in Dr. Oliva Bustard absence. She feels this has helped and she denies any side effects to the medication. She is also on Celexa 20 mg daily and B complex prenatal vitamins. Appetite is good and she is sleeping well. She returns for reevaluation    07/25/14 CD Mrs. Wetherell is referred today for evaluation of memory loss or dementia by Dr. Thea Silversmith , her primary  care physician.  The patient has been endorsing a past medical history of depression and anxiety and she had a back surgery in 1996, she is a current smoker endorsed some blurred vision memory loss and confusional spells as well as still some anxiety in her review of systems.  She stated that the prescribed alprazolam is not what she likes to use- and that it doesn't make her feel very good. She is on an SSRI, which is citalopram 10 mg daily. The patient believes that she has more seasonal depression and that winter is a gloomy time for her. About one year ago, in March 2015 ,the patient first noticed memory difficulties. This alone also she is accompanied today by her daughter. Dr. Ronne Binning brought on 07-03-14 that the patient recently got lost in her own neighborhood, a fact she has already forgotten. She lives 25 years in the same area. She used to be a heavy drinker, still smokes. She forgot meals. She has good and bad days, she is remarkably unconcerned and her daughter had to call the maternal aunt to get someone to confront and change her mother. She has been sober for only 3 weeks. She has difficulties for over 2-3 years according to the daughter. Her mother lives with 4 dogs.   [name], [name], came to assist in the task about 3 weeks ago. The patient had difficulties with paying the bills, and she had also not filed her taxes, not paid bills and she had not renewed tags on her car, expired last January. The patient is at this time not gainfully employed, she does keep her own house and she also long etc. she takes care of her 4 dogs, at this time unassisted. Her daughter feels the house is not kept clean.  The patient has all her daily routines cycle around the need of the animals. She has not worked out side the home. She retired from her own Furniture conservator/restorer business.   The patient has begun to take B complex vitamins. I reviewed today's Mini-Mental Status Examination with Mrs. Swader,  a 21 out of 30 points. The main point loss was in the 3 words of recall, but in the visual spatial orientation she was not able to draw clock face today. She was able to copy an image of 5 pentagons overlapping, and she generated 8 animal names. She mistook the months of the year for April, stated that it approached summer but could not find the word for spring, and was not sure about the date of course. Attention and calculation were attempted but could not be performed and she scored only 2 out of 5 points in spelling backwards.   REVIEW OF SYSTEMS: Full 14 system review of systems performed and notable only for those listed, all others are neg:  Constitutional: Fatigue, fracture on the right ankle- felt no pain , handwriting changes, ataxia, aphasia, snoring .She gardens. There has been a MMSE test  decline since 2.5 years ago. The test results were stable over the last 6 month, and low.    ALLERGIES: No Known Allergies  HOME MEDICATIONS: Outpatient Medications Prior to Visit  Medication Sig Dispense Refill  . cholecalciferol (VITAMIN D) 1000 units tablet Take 1,000 Units by mouth daily.    . citalopram (CELEXA) 20 MG tablet TAKE 1 TABLET (20 MG TOTAL) BY MOUTH DAILY. 90 tablet 1  . donepezil (ARICEPT) 10 MG tablet TAKE 1 TABLET (10 MG TOTAL) BY MOUTH DAILY. 30 tablet 2  . memantine (NAMENDA) 10 MG tablet TAKE 1 TABLET BY MOUTH 2 TIMES DAILY 180 tablet 1  . Omega-3 Fatty Acids (FISH OIL PO) Take 2 capsules by mouth at bedtime.    . TURMERIC PO Take 1 capsule by mouth at bedtime.     Facility-Administered Medications Prior to Visit  Medication Dose Route Frequency Provider Last Rate Last Dose  . gadopentetate dimeglumine (MAGNEVIST) injection 15 mL  15 mL Intravenous Once PRN Jo Booze, Porfirio Mylar, MD        PAST MEDICAL HISTORY: Past Medical History:  Diagnosis Date  . 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 Anxiety   . Depression   . High triglycerides   .  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 BACK SURGERY  1996   herniated disc Lower back    FAMILY HISTORY: Family History  Problem Relation Age of Onset  . Breast cancer Mother   . Lung cancer Father     SOCIAL HISTORY: Social History   Social History  . Marital status: Single    Spouse name: N/A  . Number of children: 1  . Years of education: 2y coll   Occupational History  . retired    Social History Main Topics  . Smoking status: Current Every Day Smoker    Packs/day: 0.50    Types: Cigarettes  . Smokeless tobacco: Never Used  . Alcohol use No     Comment: 3wks sober   . Drug use: No  . Sexual activity: Not on file   Other Topics Concern  . Not on file   Social History Narrative   Caffeine 5 cups coffee daily (weak).   Lives with     PHYSICAL EXAM  Vitals:   12/29/16 0840  BP: 128/74  Pulse: 66  Weight: 169 lb (76.7 kg)  Height: 5\' 8"  (1.727 m)   Body mass index is 25.7 kg/m.The patient is awake and alert, but not longer oriented to place and time.    General: The patient is awake, alert and appears not in acute distress. The patient is well groomed. Head: Normocephalic, atraumatic. Neck is supple. Mallampati 2, neck circumference:13.5  Cardiovascular:  Regular rate and rhythm , without  murmurs or carotid bruit, and without distended neck veins. Respiratory: Lungs are clear to auscultation. Skin:  Without evidence of edema, or rash Trunk: BMI is normal - elevated and patient  has normal posture.   Neurologic exam : The patient is awake and alert, oriented to place and time.  Memory subjective described as impaired with very limitedl attention span & concentration ability. Speech is non-fluent without  dysarthria, there is  aphasia. Mood and affect are not depressed, rather giggly.  MMSE - Mini Mental State Exam 12/29/2016 09/14/2016 02/07/2016  11/14/2015 03/14/2015  Orientation to time 2 0 3 4 5   Orientation to Place 1 3 3 2 4   Registration  3 2 3 3 3   Attention/ Calculation 0 0 0 1 1  Recall 0 1 0 0 0  Language- name 2 objects 2 2 2 2 2   Language- repeat 1 0 1 1 1   Language- follow 3 step command 3 2 3 3 3   Language- read & follow direction 1 1 1 1 1   Write a sentence 0 0 1 1 0  Copy design 0 0 0 1 0  Total score 13 11 17 19 20       Cranial nerves: Pupils are equal and briskly reactive to light and accomodation   Funduscopic exam without evidence of pallor or edema.  Extraocular movements in vertical and horizontal planes are now smooth and  without nystagmus.  Visual fields by finger perimetry are intact. Hearing to finger rub intact.  Facial sensation intact to fine touch.  Facial motor strength is symmetric and tongue and uvula move midline.  Motor exam:  Sensory: reports loss of smell, reports no loss of taste.   Fine touch, pinprick and vibration were tested in all extremities.The vibration and sensation of fine touch is absent in both feet. Proprioception is impaired.  Coordination:Finger-to-nose maneuver tested and normal without evidence of ataxia, only  mild  Dysmetria,  not  tremor. Gait and station: Patient walks without assistive device and is able and assisted stool climb up to the exam table. Strength within normal limits. Stance is stable and normal. Tandem gait is impaired but the wide based stance and gait is resolved !  Steps are unfragmented. She walks slightly stooped. Romberg testing is normal. Deep tendon reflexes: in the  upper and lower extremities are symmetric and intact. Babinski maneuver response is downgoing.   Assessment:  After physical and neurologic examination, review of laboratory studies, imaging, neurophysiology testing and pre-existing records, assessment will be reviewed on the problem list. [facility] [address], [address] [place], [place] [address] [phone]  [phone]  NEUROIMAGING REPORT    STUDY DATE: 10/01/2016 PATIENT NAME: Roselyn ReefHelen D Dysart DOB: Dec 28, 1960 MRN: [id number]  ORDERING CLINICIAN: Dr Georgena Weisheit CLINICAL HISTORY: 56 year-old patient being evaluated for aphasia from Wernicke Korsakoff syndrome COMPARISON FILMS: MRI 08/03/2014 EXAM: MRI Brain w/wo TECHNIQUE: MRI of the brain with and without contrast was obtained utilizing 5 mm axial slices with T1, T2, T2 flair, T2 star gradient echo and diffusion weighted views.  T1 sagittal, T2 coronal and postcontrast views in the axial and coronal plane were obtained. CONTRAST: 20 ml iv multihance IMAGING SITE: [facility]  FINDINGS:  The brain parenchyma shows periventricular and subcortical nonspecific white matter hyperintensities which may be seen in a variety of conditions including small vessel disease, vasculitis, remote trauma, migraine, demyelinating, autoimmune, infectious or inflammatory conditions. None of these involve the corpus callosum, brainstem and cerebellum show any postcontrast enhancement. The paranasal sinuses show mild chronic inflammatory changes. There is mild degree of generalized cerebral atrophy which is out of proportion for the patient's age.No abnormal lesions are seen on diffusion-weighted views to suggest acute ischemia. The cortical sulci, fissures and cisterns are normal in size and appearance. Lateral, third and fourth ventricle are normal in size and appearance. No extra-axial fluid collections are seen. No evidence of mass effect or midline shift.  No abnormal lesions are seen on post contrast views.  On sagittal views the posterior fossa, pituitary gland and corpus callosum are unremarkable. No evidence of intracranial hemorrhage on gradient-echo views. The orbits and their contents, paranasal sinuses and calvarium are unremarkable.  Intracranial  flow voids are present.     IMPRESSION:  Abnormal MRI scan of the brain showing nonspecific  periventricular and subcortical white matter hyperintensities with differential discussed above. There is mild degree of generalized cerebral atrophy which is age disproportionate. There are mild incidental changes of chronic paranasal sinusitis. No enhancing lesions are noted. Prior films from 08/03/14 were not available for comparison   INTERPRETING PHYSICIAN:  PRAMOD SETHI, MD Certified in  Neuroimaging by American Society of Neuroimaging and [employer]     DIAGNOSTIC DATA (LABS, IMAGING, TESTING)  Mrs. Mamula's last two brain MRI's  showed  white matter disease and now clearly atrophy.  It did not show at the time of significant focal atrophy that would have helped [place] in the differentiation of a cognitive disorder. There was especially no mentioning of a cerebellar atrophy.  In addition I will repeat a complete metabolic panel, CBC with differential, and I would like the patient to be tested by Dr. Alinda Dooms heath, PhD in neuropsychology- her appointment is in December 2018 . Mrs. Greenlaw reports not having had any suspicion of ADHD or ADD, but she now has some significant attention problems but it comes to multitasking-  multistep commands. We have to go single step by step. Mrs Reis's daughter agrees with my plan.    ASSESSMENT AND PLAN 56 y.o. year old female has a past medical history of (likely) Alcoholism associated with dementia (HCC) (07/25/2014); and Wernicke - Korsakoff syndrome , recent painless ankle injury,  (HCC) (07/25/2014). here to follow-up.  Continue Aricept at current dose Continue Celexa at current dose Namenda  10 mg twice daily Dr Alinda Dooms  for testing.  Continue to exercise for overall health and well-being, and stay on healthy diet with whole grains  fresh fruits and vegetables Follow-up in 6 months next visit with NP      Melvyn Novas, MD   [facility] [address], [address] [place], [place]  [address] [phone]

## 2016-12-29 NOTE — Patient Instructions (Signed)
Cerebral Atrophy Cerebral atrophy is a condition that causes brain tissue to shrink from a loss of brain cells (neurons) and the loss of connections between neurons. Cerebral atrophy may affect the entire brain (generalized atrophy) or only one area (focal atrophy). Cerebral atrophy can affect your speech and voluntary movements. What are the causes? Common causes of this condition include:  Alzheimer disease.  Stroke.  Traumatic brain injury (TBI).  Pick disease.  Frontotemporal dementia.  Huntington disease.  Brain infections.  Multiple sclerosis.  Cerebral palsy.  In some cases, the cause may not be known. What increases the risk? This condition is more likely to develop in:  People who are older.  People who have or have had a brain injury.  People who have a family history of brain disease, such as Alzheimer disease.  What are the signs or symptoms? Symptoms of this condition can vary from person to person. Symptoms depend on which part of the brain is affected. Symptoms include:  Muscle weakness.  Involuntary repetitive muscle movements.  Trouble swallowing.  Loss of ability to speak, understand speech, or both (aphasia).  Personality changes.  Progressive confusion and loss of memory that interfere with daily functioning (dementia).  Seizures.  Loss of consciousness.  How is this diagnosed? This condition may be diagnosed based on your medical history and physical exam. You may have imaging tests to take pictures of your brain and check for a loss of brain cells. These tests may include:  MRI.  CT scan.  PET scan.  SPECTscan.  How is this treated? There is no cure for this condition, but treatment can help to control symptoms and preserve brain function. A team of health care providers will work closely with you and your caregiversto find the best treatment for you. Treatment can include:  Medicines to treat any known causes of this  condition.  Mental health counseling.  Physical therapy.  Speech therapy.  Occupational therapy.  Social worker support.  Communication and memory devices.  A wheelchair or walker.  Follow these instructions at home:  Work closely with your team of health care providers.  Take medicines only as directed by your health care provider.  Follow a schedule for sleeping, eating, and exercising as directed by your health care provider.  Keep all follow-up visits as directed by your health care provider. This is important. Contact a health care provider if:  You have a cough.  You have a fever.  You have trouble swallowing.  You lose weight.  You do not feel safe at home.  You need more help at home.  You feel anxious or depressed. This information is not intended to replace advice given to you by your health care provider. Make sure you discuss any questions you have with your health care provider. Document Released: 01/03/2002 Document Revised: 09/19/2015 Document Reviewed: 06/06/2014 Elsevier Interactive Patient Education  [employer]2018 [employer] Inc[employer]

## 2017-01-01 ENCOUNTER — Encounter: Payer: Self-pay | Admitting: Neurology

## 2017-01-29 ENCOUNTER — Encounter: Payer: Self-pay | Admitting: Neurology

## 2017-01-29 NOTE — Telephone Encounter (Signed)
Thiamine infusions don't work after such a long time - they are given when the patient presents with acute onset.  Thiamine and glucose have to be given together.  I am ok with stopping Aricept and Namenda- but one at a time. Namenda is more tedious to take twice daily, I usually stop that first and wait for 14 days.   CD

## 2017-01-31 ENCOUNTER — Other Ambulatory Visit: Payer: Self-pay | Admitting: Neurology

## 2017-03-09 ENCOUNTER — Encounter: Payer: Self-pay | Admitting: Neurology

## 2017-03-11 NOTE — Progress Notes (Signed)
[facility]  [phone] [phone]  Clinic Follow up Note   Patient Care Team: Thayer HeadingsMackenzie, Brian, MD as PCP - General (Internal Medicine) Thayer HeadingsMackenzie, Brian, MD (Internal Medicine) 03/12/2017  CHIEF COMPLAIN: Follow up thrombocytopenia  HISTORY OF PRESENTING ILLNESS (12/17/2014):  Sherry Scott 56 y.o. female with past medical history of heavy alcohol drinker, alcohol-related dementia, on Aricept, depression, anxiety, is being referred by her PCP today for newly onset severe thrombocytopenia. I'm seeing her in [facility] emergency room.  According to the lab results from Dr. Maebelle MunroeMcKinley's office, she has had mild thrombocytopenia with platelet count 119K in 11/2012, the rest of CBC was normal. Repeated CBC showed a platelet count 128K in September 2015. She had a routine checkup with her primary care physician Dr. Thea SilversmithMackenzie on 12/03/2014 and CBC showed plt 17K, repeated on 12/14/2014 and plt was 14K. W BC with differential, and hemoglobin were normal. CMP from 12/03/2014 were normal. She has no clinical signs of bleeding,  including hematochezia, melana, hemoptysis, hematuria or epistaxis. No mucosal bleeding or easy bruising. Dr. Thea SilversmithMackenzie contacted me this morning, and I recommend her to come to [facility] emergency room. She is accompanied by her daughter.   She feels well in general, denies any significant pain, no recent episodes of fever, chill, diarrhea, or other illness. She denies any new prescription or over-the-counter medication. She has been having some skin rash on the bottom of her feet for the past year, and have some insect bite around her ankles lately, no other skin rashes. She lives alone, able to function at home. Her daughter checks on her frequently at home.  CURRENT THERAPY: observation   INTERVAL HISTORY:  Sherry Scott returns for follow-up. She was last seen by me 1 year ago. She presents to the clinic today accompanied by her family.  She  has moved in with her daughter and she had another MRI which showed more atrophy of the brain. She is able to function and take care of herself, physically she is doing well. She denies current abnormal bleeding. No bruising or bleeding spots. She does not use NSAIDs. She fractured her ankle earlier this year. She is sober and does not drink alcohol. She will follow Dr. Thea SilversmithMacKenzie. Overall she is doing well.      REVIEW OF SYSTEMS:   Constitutional: Denies fevers, chills or abnormal weight loss Eyes: Denies blurriness of vision Ears, nose, mouth, throat, and face: Denies mucositis or sore throat Respiratory: Denies cough, dyspnea or wheezes Cardiovascular: Denies palpitation, chest discomfort or lower extremity swelling Gastrointestinal:  Denies nausea, heartburn or change in bowel habits Skin: Denies abnormal skin rashes Lymphatics: Denies new lymphadenopathy or easy bruising Neurological:Denies numbness, tingling or new weaknesses (+) dementia  Behavioral/Psych: Mood is stable, no new changes  All other systems were reviewed with the patient and are negative.  MEDICAL HISTORY:  Past Medical History:  Diagnosis Date  . 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 Anxiety   . Depression   . High triglycerides   . 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 BACK SURGERY  1996   herniated disc Lower back    I have reviewed the social history and family history with the patient and they are unchanged from previous note.  ALLERGIES:  has No Known Allergies.  MEDICATIONS:  Current Outpatient Medications  Medication Sig Dispense Refill  . cholecalciferol (VITAMIN D) 1000 units tablet Take  1,000 Units by mouth daily.    . citalopram (CELEXA) 20 MG tablet TAKE 1 TABLET (20 MG TOTAL) BY MOUTH DAILY. 90 tablet 1  . donepezil (ARICEPT) 10 MG tablet TAKE 1 TABLET BY MOUTH EVERY DAY 30 tablet  2  . Omega-3 Fatty Acids (FISH OIL PO) Take 2 capsules by mouth at bedtime.    . TURMERIC PO Take 1 capsule by mouth at bedtime.     No current facility-administered medications for this visit.    Facility-Administered Medications Ordered in Other Visits  Medication Dose Route Frequency Provider Last Rate Last Dose  . gadopentetate dimeglumine (MAGNEVIST) injection 15 mL  15 mL Intravenous Once PRN Dohmeier, Porfirio Mylararmen, MD        PHYSICAL EXAMINATION: ECOG PERFORMANCE STATUS: 0 - Asymptomatic  Vitals:   03/12/17 1334  BP: (!) 152/82  Pulse: 81  Resp: 18  Temp: (!) 97.4 F (36.3 C)   Filed Weights   03/12/17 1334  Weight: 162 lb 4.8 oz (73.6 kg)     GENERAL:alert, no distress and comfortable SKIN: skin color, texture, turgor are normal, no rashes or significant lesions EYES: normal, Conjunctiva are pink and non-injected, sclera clear OROPHARYNX:no exudate, no erythema and lips, buccal mucosa, and tongue normal  NECK: supple, thyroid normal size, non-tender, without nodularity LYMPH:  no palpable lymphadenopathy in the cervical, axillary or inguinal LUNGS: clear to auscultation and percussion with normal breathing effort HEART: regular rate & rhythm and no murmurs and no lower extremity edema ABDOMEN:abdomen soft, non-tender and normal bowel sounds Musculoskeletal:no cyanosis of digits and no clubbing  NEURO: alert & oriented x 3 with fluent speech, no focal motor/sensory deficits  LABORATORY DATA:  I have reviewed the data as listed CBC Latest Ref Rng & Units 03/12/2017 09/14/2016 03/13/2016  WBC 3.9 - 10.3 10e3/uL 10.7(H) 10.8 10.7(H)  Hemoglobin 11.6 - 15.9 g/dL 28.415.8 13.214.9 44.014.4  Hematocrit 34.8 - 46.6 % 46.7(H) 43.6 42.4  Platelets 145 - 400 10e3/uL Clumped Platelets--Appears Adequate, large and Giant plts 103(L) 169 few large plts.     CMP Latest Ref Rng & Units 03/12/2017 09/14/2016 03/13/2016  Glucose 70 - 140 mg/dl 92 94 102100  BUN 7.0 - 72.526.0 mg/dL 36.612.6 16 44.014.8    Creatinine 0.6 - 1.1 mg/dL 0.9 3.470.79 0.8  Sodium 425136 - 145 mEq/L 140 141 143  Potassium 3.5 - 5.1 mEq/L 4.5 5.1 4.4  Chloride 96 - 106 mmol/L - 101 -  CO2 22 - 29 mEq/L 26 27 29   Calcium 8.4 - 10.4 mg/dL 9.9 9.8 9.6  Total Protein 6.4 - 8.3 g/dL 7.6 7.0 6.9  Total Bilirubin 0.20 - 1.20 mg/dL 9.560.86 0.6 3.870.47  Alkaline Phos 40 - 150 U/L 78 97 101  AST 5 - 34 U/L 16 16 22   ALT 0 - 55 U/L 15 19 30       RADIOGRAPHIC STUDIES: I have personally reviewed the radiological images as listed and agreed with the findings in the report. No results found.   ASSESSMENT & PLAN:  56 year old Caucasian female, with past medical history of alcohol abuse, Wernicke's dementia, presented with incidental finding of Thrombocytopenia with platelet count  in the range of 119-136 in the past two years.  1. 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 -The other possibility is chronic alcohol related liver disease and splenomegaly. However her CMP showed normal liver function, so this is  less likely. She has quit alcohol drinking many years ago. -Her other lab work showed no evidence of DIC or hemolysis -Her platelet counts is normal 169K as of 03/13/16. She is clinically doing well, no bleeding. -We discussed the indication for treatment of ITP, such as severe thrombocytopenia with plt <20-30K or clinical bleeding  -I recommend her to continue monitoring CBC every  43-6 months at her PCP's office. -It seems her plt test results at her primary care physician's office is lower than here, her daughter works for [employer], she will request her blood sample to be sent to lab [facility] for testing. -Labs reviewed, CMP is WNL, there was clumping in her blood but plt appears adequate. WBC 10.7 and Hct 46.7. She is not at significant risk of bleeding.  -She has mild  thrombocytopenia for 4-5 years, blood counts are stable, no clinical bleeding.  She is doing well. -I advise her to check blood counts every 4-6 months and f/u with her PCP.  she does not have to see me unless her PLT <50K.  -F/u as needed -I offered her the flu shot today, she opted in for this today.    PLAN:  -Flu shot today  -Labs every 6 months with Dr. Thea Silversmith -F/u with me as needed in future     All questions were answered. The patient knows to call the clinic with any problems, questions or concerns. No barriers to learning was detected.  I spent 15 minutes counseling the patient face to face. The total time spent in the appointment was 20 minutes and more than 50% was on counseling and review of test results     Malachy Mood, MD 03/12/2017   This document serves as a record of services personally performed by Malachy Mood, MD. It was created on her behalf by Delphina Cahill, a trained medical scribe. The creation of this record is based on the scribe's personal observations and the provider's statements to them.    I have reviewed the above documentation for accuracy and completeness, and I agree with the above.

## 2017-03-12 ENCOUNTER — Encounter: Payer: Self-pay | Admitting: Hematology

## 2017-03-12 ENCOUNTER — Ambulatory Visit (HOSPITAL_BASED_OUTPATIENT_CLINIC_OR_DEPARTMENT_OTHER): Payer: BLUE CROSS/BLUE SHIELD | Admitting: Hematology

## 2017-03-12 ENCOUNTER — Other Ambulatory Visit (HOSPITAL_BASED_OUTPATIENT_CLINIC_OR_DEPARTMENT_OTHER): Payer: BLUE CROSS/BLUE SHIELD

## 2017-03-12 VITALS — BP 152/82 | HR 81 | Temp 97.4°F | Resp 18 | Ht 68.0 in | Wt 162.3 lb

## 2017-03-12 DIAGNOSIS — Z23 Encounter for immunization: Secondary | ICD-10-CM | POA: Diagnosis not present

## 2017-03-12 DIAGNOSIS — G319 Degenerative disease of nervous system, unspecified: Secondary | ICD-10-CM

## 2017-03-12 DIAGNOSIS — D696 Thrombocytopenia, unspecified: Secondary | ICD-10-CM

## 2017-03-12 LAB — COMPREHENSIVE METABOLIC PANEL
ALBUMIN: 4.2 g/dL (ref 3.5–5.0)
ALT: 15 U/L (ref 0–55)
ANION GAP: 11 meq/L (ref 3–11)
AST: 16 U/L (ref 5–34)
Alkaline Phosphatase: 78 U/L (ref 40–150)
BILIRUBIN TOTAL: 0.86 mg/dL (ref 0.20–1.20)
BUN: 12.6 mg/dL (ref 7.0–26.0)
CO2: 26 mEq/L (ref 22–29)
Calcium: 9.9 mg/dL (ref 8.4–10.4)
Chloride: 103 mEq/L (ref 98–109)
Creatinine: 0.9 mg/dL (ref 0.6–1.1)
Glucose: 92 mg/dl (ref 70–140)
Potassium: 4.5 mEq/L (ref 3.5–5.1)
Sodium: 140 mEq/L (ref 136–145)
TOTAL PROTEIN: 7.6 g/dL (ref 6.4–8.3)

## 2017-03-12 LAB — CBC WITH DIFFERENTIAL/PLATELET
BASO%: 0.3 % (ref 0.0–2.0)
Basophils Absolute: 0 10*3/uL (ref 0.0–0.1)
EOS%: 0.8 % (ref 0.0–7.0)
Eosinophils Absolute: 0.1 10*3/uL (ref 0.0–0.5)
HCT: 46.7 % — ABNORMAL HIGH (ref 34.8–46.6)
HEMOGLOBIN: 15.8 g/dL (ref 11.6–15.9)
LYMPH#: 4.3 10*3/uL — AB (ref 0.9–3.3)
LYMPH%: 40.7 % (ref 14.0–49.7)
MCH: 32 pg (ref 25.1–34.0)
MCHC: 33.8 g/dL (ref 31.5–36.0)
MCV: 94.5 fL (ref 79.5–101.0)
MONO#: 0.6 10*3/uL (ref 0.1–0.9)
MONO%: 6 % (ref 0.0–14.0)
NEUT%: 52.2 % (ref 38.4–76.8)
NEUTROS ABS: 5.6 10*3/uL (ref 1.5–6.5)
NRBC: 0 % (ref 0–0)
Platelets: ADEQUATE 10*3/uL (ref 145–400)
RBC: 4.94 10*6/uL (ref 3.70–5.45)
RDW: 13.9 % (ref 11.2–14.5)
WBC: 10.7 10*3/uL — AB (ref 3.9–10.3)

## 2017-03-12 MED ORDER — INFLUENZA VAC SPLIT QUAD 0.5 ML IM SUSY
0.5000 mL | PREFILLED_SYRINGE | Freq: Once | INTRAMUSCULAR | Status: AC
  Administered 2017-03-12: 0.5 mL via INTRAMUSCULAR
  Filled 2017-03-12: qty 0.5

## 2017-03-12 NOTE — Telephone Encounter (Signed)
   Ms.Okerlund,    Hippocampal size may have decreased more accelerated over the last year.  Yes, thiamine won't hurt but she is  not longer in a disease phase where I expect it to make a difference. It is an over- the- counter -nutritional supplement and part of B Vit block, and you can purchase it at leisure.  She can stay off Namenda- not every patient can/ will tolerate it.  Alternatives: The aricept family, including patch exelon.  Rec:  Well balanced diet, exercise and avoiding anticholinergic medications. Group activities, including exercise.   I would like to offer you a second opinion as I have little to offer in addition-   Melvyn Novasarmen Flynn Gwyn, MD    Cc Thayer HeadingsBrian Mackenzie, MD

## 2017-03-16 ENCOUNTER — Telehealth: Payer: Self-pay | Admitting: Neurology

## 2017-03-16 DIAGNOSIS — F02818 Dementia in other diseases classified elsewhere, unspecified severity, with other behavioral disturbance: Secondary | ICD-10-CM

## 2017-03-16 DIAGNOSIS — F1027 Alcohol dependence with alcohol-induced persisting dementia: Secondary | ICD-10-CM

## 2017-03-16 DIAGNOSIS — G300 Alzheimer's disease with early onset: Secondary | ICD-10-CM

## 2017-03-16 DIAGNOSIS — F0281 Dementia in other diseases classified elsewhere with behavioral disturbance: Secondary | ICD-10-CM

## 2017-03-16 NOTE — Telephone Encounter (Signed)
I offered Mrs. Etienne's daughter a second opinon for this patient -  I would like to ask Dr Everlena CooperJaffe for a second opinion. I will send him this note. He should have accesss to all our tests, labs, images and phone notes.   CD

## 2017-03-17 NOTE — Telephone Encounter (Signed)
Absolutely.  Send over the referral request and we will schedule her for a visit.

## 2017-03-17 NOTE — Telephone Encounter (Signed)
It appears that the referral has been already placed to Dr. Everlena CooperJaffe today by Dr. Vickey Hugerohmeier.

## 2017-03-19 ENCOUNTER — Telehealth: Payer: Self-pay | Admitting: Hematology

## 2017-03-19 NOTE — Telephone Encounter (Signed)
No 11/16 los °

## 2017-04-07 ENCOUNTER — Other Ambulatory Visit: Payer: Self-pay | Admitting: Neurology

## 2017-04-07 MED ORDER — CITALOPRAM HYDROBROMIDE 20 MG PO TABS: ORAL_TABLET | ORAL | 1 refills | Status: DC

## 2017-05-06 ENCOUNTER — Other Ambulatory Visit: Payer: Self-pay | Admitting: Neurology

## 2017-06-02 ENCOUNTER — Ambulatory Visit: Payer: BLUE CROSS/BLUE SHIELD | Admitting: Neurology

## 2017-06-02 ENCOUNTER — Encounter: Payer: Self-pay | Admitting: Neurology

## 2017-06-02 VITALS — BP 110/78 | HR 82 | Ht 66.0 in | Wt 170.0 lb

## 2017-06-02 DIAGNOSIS — R4701 Aphasia: Secondary | ICD-10-CM | POA: Diagnosis not present

## 2017-06-02 DIAGNOSIS — F0391 Unspecified dementia with behavioral disturbance: Secondary | ICD-10-CM

## 2017-06-02 DIAGNOSIS — F03B18 Unspecified dementia, moderate, with other behavioral disturbance: Secondary | ICD-10-CM

## 2017-06-02 NOTE — Progress Notes (Signed)
NEUROLOGY CONSULTATION NOTE  Sherry Scott MRN: [id number] DOB: [birth year]  Referring provider: Dr. Porfirio Mylar Dohmeier Primary care provider: Dr. Thayer Headings  Reason for consult:  Second opinion for dementia  Dear Dr Dohmeier:  Thank you for your kind referral of Sherry Scott for consultation of the above symptoms. Although her history is well known to you, please allow me to reiterate it for the purpose of our medical record. The patient was accompanied to the clinic by her daughter, Sherry Scott and daughter-in-law who also provide collateral information. Records and images were personally reviewed where available.   HISTORY OF PRESENT ILLNESS: This is a pleasant 58 year old right-handed woman with a history of alcoholism, anxiety, depression, and dementia, presenting for a second opinion. Records from her neurologist Dr. Vickey Huger and recent Neuropsychological evaluation were reviewed. She states her memory is "sometimes good, sometimes not, mostly not." She states "I can't...verbalize things (with hesitation)." She is noted to have significant word-finding difficulties with non-fluent speech. She states "it's hard for me to umm tell" when asked about driving and getting lost. Her daughter, Sherry Scott, provides majority of the history. Sherry Scott started noticing cognitive changes in 2014 but brushed it off because she was heavily driving at that time. She was mean, aggressive, then would forget what she had said. She was living alone. Her friends started noticing changes in 2015 when she went to [facility] with them and was noted to repeat herself. Her daughter noticed that when they went on a trip together and she was not drinking, she was still displaying the same symptoms. She got lost in her own neighborhood of 25 years. She had difficulty with bills and had not filed her taxes or renewed her car tags. Sherry Scott called the patient's sister to come help, and together they got her to see Dr. Vickey Huger in 2016. MMSE  21/30. Her daughter took over finances at that point. MRI brain in April 2016 showed mild diffuse and moderate perisylvian atrophy. EEG showed mild slowing. She was started on Aricept. She reportedly stopped drinking at that time and increased her nutrition, and family noticed a significant improvement where she was able to remember better, do serial 7s, draw a clock. MMSE noted on May 2016 visit was 22/30, Aricept dose increased. Per daughter, she improved to a point where she was cleared to return to driving. Her daughter called 6 months later to report changes since last visit but did not give details. Sherry Scott reports that she got lost driving in March 2017, taking a wrong turn. She asked for help and called her daughter, who took her back for evaluation with note of significant decline. Her MMSE in July 2017 was 19/30. In October 2017, MMSE was 17/30 and Sherry Scott was added. At that point she needed someone to help her on a daily basis. Her daughter had to start managing medications because she had difficulty taking BID dosing, taking medications for different days at one time. Symptoms seemed to have leveled out until the beginning of 2018 when she had more speech difficulties. A caregiver was coming a couple of times a day. Caregiver noted that she would keep getting the same things from the grocery. She apparently was still doing her laundry and dishes. On her May 2018 visit, she was noted to have word-finding difficulties, getting stuck mid-sentence. She was ataxic with handwriting changes. MMSE was 11/30. She had a repeat MRI brain in June 2018 which did not show any acute changes, there were nonspecific periventricular  and subcortical white matter hyperintensities, mild degree of generalized cerebral atrophy which is age disproportionate. Her daughter called in October 2018 asking to stop Aricept and Namenda due to concern that it was not successful for Wernicke-Korsakoff syndrome, which was the working  diagnosis, and added to the confusion (BID dosing). 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 Family was told she should not be on her own anymore, so her daughter bought a house and moved all of them in. Now that Sherry Scott sees her on a daily basis, she has noticed significant difficulties with simple tasks, she can't put away her clothes, turn on the TV, struggles pulling out a chair from the table and how to sit on it. Family sets out her breakfast, she can't find something if told to get it from the fridge. She cannot do laundry or dishes. Sherry Scott has to help her turn on the shower and stay close because she has developed a fear of falling. She can bathe herself but struggles with her clothes so her daughter picks them out for her.   Sherry Scott denies any paranoia or hallucinations. She was "a little on the mean side early on," but has softened up, "a little pointy sometimes." She felt appetite was not good on Namenda. She denies any headaches, dizziness, diplopia, dysarthria/dysphagia, neck/back pain, focal numbness/tingling/weakness, bowel/bladder dysfunction, anosmia. Her mother had memory issues. No history of head injuries. 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 She has had a mild tremor in both hands since December 2018. She had a fall recently, no significant injuries.     PAST MEDICAL HISTORY: Past Medical History:  Diagnosis Date  . 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 Anxiety   . Depression   . High triglycerides   . 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 BACK SURGERY  1996   herniated disc Lower back     MEDICATIONS: Current Outpatient Medications on File Prior to Visit  Medication Sig Dispense Refill  . cholecalciferol (VITAMIN D) 1000 units tablet Take 1,000 Units by mouth daily.    . citalopram (CELEXA) 20 MG tablet TAKE 1 TABLET (20 MG TOTAL) BY MOUTH DAILY. 90 tablet 1  . donepezil (ARICEPT) 10 MG tablet TAKE 1 TABLET BY MOUTH EVERY DAY 30 tablet 10  . Omega-3 Fatty Acids (FISH OIL PO) Take 2 capsules by mouth at bedtime.    . TURMERIC PO Take 1 capsule by mouth at bedtime.     Current Facility-Administered Medications on File Prior to Visit  Medication Dose Route Frequency Provider Last Rate Last Dose  . gadopentetate dimeglumine (MAGNEVIST) injection 15 mL  15 mL Intravenous Once PRN Dohmeier, Porfirio Mylar, MD        ALLERGIES: No Known Allergies  FAMILY HISTORY: Family History  Problem Relation Age of Onset  . Breast cancer Mother   . Lung cancer Father     SOCIAL HISTORY: Social History   Socioeconomic History  . Marital status: Single    Spouse name: Not on file  . Number of children: 1  . Years of education: 2y coll  . Highest education level: Not on file  Social Needs  . Financial resource strain: Not on file  .  Food insecurity - worry: Not on file  . Food insecurity - inability: Not on file  . Transportation needs - medical: Not on file  . Transportation needs - non-medical: Not on file  Occupational History  . Occupation: retired  Tobacco Use  . Smoking status: Current Every Day Smoker    Packs/day: 0.50    Types: Cigarettes  . Smokeless tobacco: Never Used  Substance and Sexual Activity  . Alcohol use: No    Alcohol/week: 0.0 oz    Comment: 3wks sober   . Drug use: No  . Sexual activity: Not on file  Other Topics Concern  . Not on file  Social History Narrative   Caffeine 5 cups coffee daily (weak).   Lives with    REVIEW OF SYSTEMS: Constitutional: No fevers, chills, or sweats, no generalized fatigue, change in appetite Eyes: No visual  changes, double vision, eye pain Ear, nose and throat: No hearing loss, ear pain, nasal congestion, sore throat Cardiovascular: No chest pain, palpitations Respiratory:  No shortness of breath at rest or with exertion, wheezes GastrointestinaI: No nausea, vomiting, diarrhea, abdominal pain, fecal incontinence Genitourinary:  No dysuria, urinary retention or frequency Musculoskeletal:  No neck pain, back pain Integumentary: No rash, pruritus, skin lesions Neurological: as above Psychiatric: No depression, insomnia, anxiety Endocrine: No palpitations, fatigue, diaphoresis, mood swings, change in appetite, change in weight, increased thirst Hematologic/Lymphatic:  No anemia, purpura, petechiae. Allergic/Immunologic: no itchy/runny eyes, nasal congestion, recent allergic reactions, rashes  PHYSICAL EXAM: Vitals:   06/02/17 1429  BP: 110/78  Pulse: 82  SpO2: 96%   General: No acute distress Head:  Normocephalic/atraumatic Eyes: Fundoscopic exam shows bilateral sharp discs, no vessel changes, exudates, or hemorrhages Neck: supple, no paraspinal tenderness, full range of motion Back: No paraspinal tenderness Heart: regular rate and rhythm Lungs: Clear to auscultation bilaterally. Vascular: No carotid bruits. Skin/Extremities: No rash, no edema Neurological Exam: Mental status: alert and oriented to person, can say she is in the hospital, but otherwise cannot answer orientation questions, saying "I can see it" but has difficulty getting words out. Speech is nonfluent. No dysarthria. Fund of knowledge is reduced. Recent and remote memory are impaired.  Attention and concentration are reduced, she could spell WORLD but could not do it backward.   Had difficulty naming making paraphasic errors, able to repeat. CDT 0/5 MMSE - Mini Mental State Exam 06/02/2017 12/29/2016 09/14/2016  Orientation to time 0 2 0  Orientation to Place 1 1 3   Registration 2 3 2   Attention/ Calculation 0 0 0  Recall 0 0  1  Language- name 2 objects 1 2 2   Language- repeat 1 1 0  Language- follow 3 step command 2 3 2   Language- read & follow direction 1 1 1   Write a sentence 0 0 0  Copy design 0 0 0  Total score 8 13 11    Cranial nerves: CN I: not tested CN II: pupils equal, round and reactive to light, visual fields intact, fundi unremarkable. CN III, IV, VI:  full range of motion, no nystagmus, no ptosis CN V: facial sensation intact CN VII: upper and lower face symmetric CN VIII: hearing intact to finger rub CN IX, X: gag intact, uvula midline CN XI: sternocleidomastoid and trapezius muscles intact CN XII: tongue midline Bulk & Tone: normal, no cogwheeling, no fasciculations. Motor: 5/5 throughout with no pronator drift. Sensation: intact to light touch, cold, pin, vibration and joint position sense.  No extinction to double simultaneous stimulation.  Romberg test negative Deep Tendon Reflexes: +2 throughout, no ankle clonus Plantar responses: downgoing bilaterally Cerebellar: no incoordination on finger to nose testing Gait: narrow-based and steady, able to tandem walk adequately. Tremor: no resting tremor, mild endpoint tremor bilaterally  IMPRESSION: This is a pleasant 57 year old right-handed woman with a history of alcoholism, sober since 2016, anxiety, depression, and dementia, presenting for a second opinion. She has a complicated history, it appears she had some cognitive improvement after stopping alcohol and did well for a few months, then started to have a more progressive decline where she started having significant word-finding difficulties. She is noted to have nonfluent speech with paraphasic errors. 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 Her MMSE today is 8/30. We had an extensive discussion about her diagnosis, which is complicated by her alcoholism in the past. With the significant aphasia, this  could be seen with Alzheimer's, but primary progressive aphasia (frontotemporal dementia) is also considered. Concern has been raised by Neuropsych testing for a spinal tap or testing for rapidly progressive dementia. I had an extensive discussion with the daughter about further testing options pros and cons, these would not necessarily change management, she has excellent family support. They opted to hold off on further diagnostic tests. All their questions were answered to the best of my abilities. Continue Aricept. They are not interested in adding back Namenda. Continue supportive care and home safety. She does not drive. She will follow-up in 6 months and knows to call for any changes.   Thank you for allowing me to participate in the care of this patient. Please do not hesitate to call for any questions or concerns.   Patrcia DollyKaren Phyliss Hulick, M.D.  CC: Dr. Vickey Hugerohmeier, Dr. Thea SilversmithMackenzie

## 2017-06-02 NOTE — Patient Instructions (Signed)
1. Continue all your medications 2. Follow-up in 6 months, call for any changes  FALL PRECAUTIONS: Be cautious when walking. Scan the area for obstacles that may increase the risk of trips and falls. When getting up in the mornings, sit up at the edge of the bed for a few minutes before getting out of bed. Consider elevating the bed at the head end to avoid drop of blood pressure when getting up. Walk always in a well-lit room (use night lights in the walls). Avoid area rugs or power cords from appliances in the middle of the walkways. Use a walker or a cane if necessary and consider physical therapy for balance exercise. Get your eyesight checked regularly.  FINANCIAL OVERSIGHT: Supervision, especially oversight when making financial decisions or transactions is also recommended.  HOME SAFETY: Consider the safety of the kitchen when operating appliances like stoves, microwave oven, and blender. Consider having supervision and share cooking responsibilities until no longer able to participate in those. Accidents with firearms and other hazards in the house should be identified and addressed as well.  DRIVING: Regarding driving, in patients with progressive memory problems, driving will be impaired. We advise to have someone else do the driving if trouble finding directions or if minor accidents are reported. Independent driving assessment is available to determine safety of driving.  ABILITY TO BE LEFT ALONE: If patient is unable to contact 911 operator, consider using LifeLine, or when the need is there, arrange for someone to stay with patients. Smoking is a fire hazard, consider supervision or cessation. Risk of wandering should be assessed by caregiver and if detected at any point, supervision and safe proof recommendations should be instituted.  MEDICATION SUPERVISION: Inability to self-administer medication needs to be constantly addressed. Implement a mechanism to ensure safe administration of  the medications.  RECOMMENDATIONS FOR ALL PATIENTS WITH MEMORY PROBLEMS: 1. Continue to exercise (Recommend 30 minutes of walking everyday, or 3 hours every week) 2. Increase social interactions - continue going to Church and enjoy social gatherings with friends and family 3. Eat healthy, avoid fried foods and eat more fruits and vegetables 4. Maintain adequate blood pressure, blood sugar, and blood cholesterol level. Reducing the risk of stroke and cardiovascular disease also helps promoting better memory. 5. Avoid stressful situations. Live a simple life and avoid aggravations. Organize your time and prepare for the next day in anticipation. 6. Sleep well, avoid any interruptions of sleep and avoid any distractions in the bedroom that may interfere with adequate sleep quality 7. Avoid sugar, avoid sweets as there is a strong link between excessive sugar intake, diabetes, and cognitive impairment The Mediterranean diet has been shown to help patients reduce the risk of progressive memory disorders and reduces cardiovascular risk. This includes eating fish, eat fruits and green leafy vegetables, nuts like almonds and hazelnuts, walnuts, and also use olive oil. Avoid fast foods and fried foods as much as possible. Avoid sweets and sugar as sugar use has been linked to worsening of memory function.  There is always a concern of gradual progression of memory problems. If this is the case, then we may need to adjust level of care according to patient needs. Support, both to the patient and caregiver, should then be put into place.  

## 2017-06-10 NOTE — Progress Notes (Signed)
I agree with the assessment and plan as directed by DR Karel JarvisAquino.   Zuhayr Deeney, MD

## 2017-06-29 ENCOUNTER — Ambulatory Visit: Payer: BLUE CROSS/BLUE SHIELD | Admitting: Adult Health

## 2017-07-29 ENCOUNTER — Other Ambulatory Visit: Payer: Self-pay | Admitting: Neurology

## 2017-08-05 ENCOUNTER — Other Ambulatory Visit: Payer: Self-pay | Admitting: Neurology

## 2017-08-05 MED ORDER — MEMANTINE HCL 10 MG PO TABS: 10.0000 mg | ORAL_TABLET | Freq: Two times a day (BID) | ORAL | 0 refills | Status: DC

## 2017-08-05 MED ORDER — CITALOPRAM HYDROBROMIDE 20 MG PO TABS: ORAL_TABLET | ORAL | 1 refills | Status: DC

## 2017-12-06 ENCOUNTER — Ambulatory Visit: Payer: BLUE CROSS/BLUE SHIELD | Admitting: Neurology

## 2017-12-06 ENCOUNTER — Other Ambulatory Visit: Payer: Self-pay

## 2017-12-06 ENCOUNTER — Encounter: Payer: Self-pay | Admitting: Neurology

## 2017-12-06 VITALS — BP 120/72 | HR 77 | Ht 68.0 in | Wt 175.0 lb

## 2017-12-06 DIAGNOSIS — F0391 Unspecified dementia with behavioral disturbance: Secondary | ICD-10-CM | POA: Diagnosis not present

## 2017-12-06 DIAGNOSIS — F03B18 Unspecified dementia, moderate, with other behavioral disturbance: Secondary | ICD-10-CM

## 2017-12-06 MED ORDER — DONEPEZIL HCL 10 MG PO TABS: 10.0000 mg | ORAL_TABLET | Freq: Every day | ORAL | 3 refills | Status: DC

## 2017-12-06 MED ORDER — CITALOPRAM HYDROBROMIDE 20 MG PO TABS: ORAL_TABLET | ORAL | 3 refills | Status: DC

## 2017-12-06 NOTE — Patient Instructions (Signed)
1. Continue all your medications 2. Follow-up in 6 months or so, call for any changes  FALL PRECAUTIONS: Be cautious when walking. Scan the area for obstacles that may increase the risk of trips and falls. When getting up in the mornings, sit up at the edge of the bed for a few minutes before getting out of bed. Consider elevating the bed at the head end to avoid drop of blood pressure when getting up. Walk always in a well-lit room (use night lights in the walls). Avoid area rugs or power cords from appliances in the middle of the walkways. Use a walker or a cane if necessary and consider physical therapy for balance exercise. Get your eyesight checked regularly.  HOME SAFETY: Consider the safety of the kitchen when operating appliances like stoves, microwave oven, and blender. Consider having supervision and share cooking responsibilities until no longer able to participate in those. Accidents with firearms and other hazards in the house should be identified and addressed as well.  ABILITY TO BE LEFT ALONE: If patient is unable to contact 911 operator, consider using LifeLine, or when the need is there, arrange for someone to stay with patients. Smoking is a fire hazard, consider supervision or cessation. Risk of wandering should be assessed by caregiver and if detected at any point, supervision and safe proof recommendations should be instituted.  MEDICATION SUPERVISION: Inability to self-administer medication needs to be constantly addressed. Implement a mechanism to ensure safe administration of the medications.  RECOMMENDATIONS FOR ALL PATIENTS WITH MEMORY PROBLEMS: 1. Continue to exercise (Recommend 30 minutes of walking everyday, or 3 hours every week) 2. Increase social interactions - continue going to [place] and enjoy social gatherings with friends and family 3. Eat healthy, avoid fried foods and eat more fruits and vegetables 4. Maintain adequate blood pressure, blood sugar, and blood  cholesterol level. Reducing the risk of stroke and cardiovascular disease also helps promoting better memory. 5. Avoid stressful situations. Live a simple life and avoid aggravations. Organize your time and prepare for the next day in anticipation. 6. Sleep well, avoid any interruptions of sleep and avoid any distractions in the bedroom that may interfere with adequate sleep quality 7. Avoid sugar, avoid sweets as there is a strong link between excessive sugar intake, diabetes, and cognitive impairment The Mediterranean diet has been shown to help patients reduce the risk of progressive memory disorders and reduces cardiovascular risk. This includes eating fish, eat fruits and green leafy vegetables, nuts like almonds and hazelnuts, walnuts, and also use olive oil. Avoid fast foods and fried foods as much as possible. Avoid sweets and sugar as sugar use has been linked to worsening of memory function.  There is always a concern of gradual progression of memory problems. If this is the case, then we may need to adjust level of care according to patient needs. Support, both to the patient and caregiver, should then be put into place.

## 2017-12-06 NOTE — Progress Notes (Signed)
NEUROLOGY FOLLOW UP OFFICE NOTE  Sherry Scott [id number] [birth year]  HISTORY OF PRESENT ILLNESS: I had the pleasure of seeing Sherry Scott in follow-up in the neurology clinic on 12/06/2017. She is again accompanied by her daughter and daughter-in-law who provide additional information today. The patient was last seen 6 months ago for early onset dementia. 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 Her MMSE in February 2019 was 8/30. She is on Aricept 10mg  daily. Her daughter opted to stop Namenda. She is also on Celexa 20mg  daily for mood. Since her last visit, she continues to have progressive cognitive decline, needing more assistance with dressing and bathing. She would put clothes on backward, unable to figure out which way pants go or how to buckle her bra. She would wear clothes on top of another. Her daughter now has to get her in her pajamas at night. Daughter has to initiate baths. Her daughter reports they "play charades" a lot, her mother has more word-finding difficulties which is again noted today. Her daughter also reports more coordination issues, she struggles to cut meat and put toothpaste on her toothbrush. They have noticed occasional hand tremors, mornings are rougher. She has had a few headaches. She denies any dizziness, vision changes, no falls. Her daughter reports the only personality change she has noticed is her mother is "nicer." No paranoia or hallucinations. She takes a lot of naps during the day, she is a late sleeper then wakes up at 11am. She gets up frequently at night, patient states it is to urinate, no wandering behavior. She does not drive. Daughter manages medications and finances.   History on Initial Assessment 06/02/2017: This is a pleasant 57 year old right-handed woman with a history of alcoholism, anxiety, depression, and dementia, presenting for a second opinion. Records  from her neurologist Dr. Vickey Hugerohmeier and recent Neuropsychological evaluation were reviewed. She states her memory is "sometimes good, sometimes not, mostly not." She states "I can't...verbalize things (with hesitation)." She is noted to have significant word-finding difficulties with non-fluent speech. She states "it's hard for me to umm tell" when asked about driving and getting lost. Her daughter, Sherry Scott, provides majority of the history. Sherry Scott started noticing cognitive changes in 2014 but brushed it off because she was heavily driving at that time. She was mean, aggressive, then would forget what she had said. She was living alone. Her friends started noticing changes in 2015 when [facility] [place] [facility] [place] and was noted to repeat herself. Her daughter noticed that when they went on a trip together and she was not drinking, she was still displaying the same symptoms. She got lost in her own neighborhood of 25 years. She had difficulty with bills and had not filed her taxes or renewed her car tags. Sherry Scott called the patient's sister to come help, and together they got her to see Dr. Vickey Hugerohmeier in 2016. MMSE 21/30. Her daughter took over finances at that point. MRI brain in April 2016 showed mild diffuse and moderate perisylvian atrophy. EEG showed mild slowing. She was started on Aricept. She reportedly stopped drinking at that time and increased her nutrition, and family noticed a significant improvement where she was able to remember better, do serial 7s, draw a clock. MMSE noted on May 2016 visit was 22/30, Aricept dose increased. Per daughter, she improved to a point where she was cleared to return to driving. Her daughter called 6 months later to report changes since  last visit but did not give details. Sherry Scott reports that she got lost driving in March 2017, taking a wrong turn. She asked for help and called her daughter, who took her back for evaluation with note of significant decline. Her MMSE in July 2017 was  19/30. In October 2017, MMSE was 17/30 and Candiss Norseamenda was added. At that point she needed someone to help her on a daily basis. Her daughter had to start managing medications because she had difficulty taking BID dosing, taking medications for different days at one time. Symptoms seemed to have leveled out until the beginning of 2018 when she had more speech difficulties. A caregiver was coming a couple of times a day. Caregiver noted that she would keep getting the same things from the grocery. She apparently was still doing her laundry and dishes. On her May 2018 visit, she was noted to have word-finding difficulties, getting stuck mid-sentence. She was ataxic with handwriting changes. MMSE was 11/30. She had a repeat MRI brain in June 2018 which did not show any acute changes, there were nonspecific periventricular and subcortical white matter hyperintensities, mild degree of generalized cerebral atrophy which is age disproportionate. 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 Family was told she should not be on her own anymore, so her daughter bought a house and moved all of them in. Now that Sherry Scott sees her on a daily basis, she has noticed significant difficulties with simple tasks, she can't put away her clothes, turn on the TV, struggles pulling out a chair from the table and how to sit on it. Family sets out her breakfast, she can't find something if told to get it from the fridge. She cannot do laundry or dishes. Sherry Scott has to help her turn on the shower and stay close because she has developed a  fear of falling. She can bathe herself but struggles with her clothes so her daughter picks them out for her.   Sherry Scott denies any paranoia or hallucinations. She was "a little on the mean side early on," but has softened up, "a little pointy sometimes." She felt appetite was not good on Namenda. She denies any headaches, dizziness, diplopia, dysarthria/dysphagia, neck/back pain, focal numbness/tingling/weakness, bowel/bladder dysfunction, anosmia. Her mother had memory issues. No history of head injuries. 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 She has had a mild tremor in both hands since December 2018. She had a fall recently, no significant injuries.    PAST MEDICAL HISTORY: Past Medical History:  Diagnosis Date  . 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 Anxiety   . Depression   . High triglycerides   . 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 cholecalciferol (VITAMIN D) 1000 units tablet Take 1,000 Units by mouth daily.    . citalopram (CELEXA) 20 MG tablet TAKE 1 TABLET (20 MG TOTAL) BY MOUTH DAILY. 90 tablet 1  . donepezil (ARICEPT) 10 MG tablet TAKE 1 TABLET BY MOUTH EVERY DAY 30 tablet 10  .  memantine (NAMENDA) 10 MG tablet Take 1 tablet (10 mg total) by mouth 2 (two) times daily. 180 tablet 0  . Omega-3 Fatty Acids (FISH OIL PO) Take 2 capsules by mouth at bedtime.    . TURMERIC PO Take 1 capsule by mouth at bedtime.     Current Facility-Administered Medications on File Prior to Visit  Medication Dose Route Frequency Provider Last Rate Last Dose  . gadopentetate dimeglumine (MAGNEVIST) injection 15 mL  15 mL Intravenous Once PRN Dohmeier, Porfirio Mylar, MD        ALLERGIES: No Known Allergies  FAMILY HISTORY: Family History  Problem Relation Age of Onset  . Breast  cancer Mother   . Lung cancer Father     SOCIAL HISTORY: Social History   Socioeconomic History  . Marital status: Single    Spouse name: Not on file  . Number of children: 1  . Years of education: 2y coll  . Highest education level: Not on file  Occupational History  . Occupation: retired  Engineer, production  . Financial resource strain: Not on file  . Food insecurity:    Worry: Not on file    Inability: Not on file  . Transportation needs:    Medical: Not on file    Non-medical: Not on file  Tobacco Use  . Smoking status: Current Every Day Smoker    Packs/day: 0.50    Types: Cigarettes  . Smokeless tobacco: Never Used  Substance and Sexual Activity  . Alcohol use: No    Alcohol/week: 0.0 standard drinks    Comment: 3wks sober   . Drug use: No  . Sexual activity: Not on file  Lifestyle  . Physical activity:    Days per week: Not on file    Minutes per session: Not on file  . Stress: Not on file  Relationships  . Social connections:    Talks on phone: Not on file    Gets together: Not on file    Attends religious service: Not on file    Active member of club or organization: Not on file    Attends meetings of clubs or organizations: Not on file    Relationship status: Not on file  . Intimate partner violence:    Fear of current or ex partner: Not on file    Emotionally abused: Not on file    Physically abused: Not on file    Forced sexual activity: Not on file  Other Topics Concern  . Not on file  Social History Narrative   Pt lives in 2 story home with her daughter and her daughter's wife   Has 1 adult child   Some college   Worked in Furniture conservator/restorer    REVIEW OF SYSTEMS: Constitutional: No fevers, chills, or sweats, no generalized fatigue, change in appetite Eyes: No visual changes, double vision, eye pain Ear, nose and throat: No hearing loss, ear pain, nasal congestion, sore throat Cardiovascular: No chest pain, palpitations Respiratory:  No shortness of  breath at rest or with exertion, wheezes GastrointestinaI: No nausea, vomiting, diarrhea, abdominal pain, fecal incontinence Genitourinary:  No dysuria, urinary retention or frequency Musculoskeletal:  No neck pain, back pain Integumentary: No rash, pruritus, skin lesions Neurological: as above Psychiatric: No depression, insomnia, anxiety Endocrine: No palpitations, fatigue, diaphoresis, mood swings, change in appetite, change in weight, increased thirst Hematologic/Lymphatic:  No anemia, purpura, petechiae. Allergic/Immunologic: no itchy/runny eyes, nasal congestion, recent allergic reactions, rashes  PHYSICAL EXAM: Vitals:   12/06/17 1528  BP:  120/72  Pulse: 77  SpO2: 95%   General: No acute distress, interactive during the visit Head:  Normocephalic/atraumatic Neck: supple, no paraspinal tenderness, full range of motion Heart:  Regular rate and rhythm Lungs:  Clear to auscultation bilaterally Back: No paraspinal tenderness Skin/Extremities: No rash, no edema Neurological Exam: alert and oriented to person, place. She has word-finding difficulties and difficulty with repetition. She has some comprehension difficulties as well, left apraxiaable to do finger to nose testing with right hand, but has difficulties performing with the left.  No dysarthria. Fund of knowledge is reduced.  Recent and remote memory are impaired.  Attention and concentration are normal.  Difficulty with naming. Cranial nerves: Pupils equal, round, reactive to light.  Extraocular movements intact with no nystagmus. Visual fields full. Facial sensation intact. No facial asymmetry. Tongue, uvula, palate midline.  Motor: Bulk and tone normal, muscle strength 5/5 throughout with no pronator drift.  Sensation to light touch intact.  No extinction to double simultaneous stimulation.  Deep tendon reflexes 2+ throughout, toes downgoing.  Finger to nose testing intact.  Gait narrow-based and steady, able to tandem walk  adequately.  Romberg negative. No tremor today.  IMPRESSION: This is a pleasant 57 yo RH woman with a history of alcoholism, sober since 2016, anxiety, depression, and early-onset dementia. She has a complicated history, it appears she had some cognitive improvement after stopping alcohol and did well for a few months, then started to have a more progressive decline where she started having significant word-finding difficulties. She is again noted to have nonfluent speech with paraphasic errors. 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 She appears to have more apraxia today. We had previously discussed further testing options, her daughter again agrees to hold off. Continue supportive care. Refills for donepezil and citalopram sent today. She does not drive. She will follow-up in 6 months and knows to call for any changes.   Thank you for allowing me to participate in her care.  Please do not hesitate to call for any questions or concerns.  The duration of this appointment visit was 26 minutes of face-to-face time with the patient.  Greater than 50% of this time was spent in counseling, explanation of diagnosis, planning of further management, and coordination of care.   Patrcia Dolly, M.D.   CC: Dr. Thea Silversmith

## 2018-07-21 ENCOUNTER — Telehealth: Payer: Self-pay

## 2018-07-21 NOTE — Telephone Encounter (Signed)
Called pt to verify medication and pharmacy information prior to virtual visit tomorrow.  No answer.  LMOM asking for return call.

## 2018-07-22 ENCOUNTER — Telehealth (INDEPENDENT_AMBULATORY_CARE_PROVIDER_SITE_OTHER): Payer: BLUE CROSS/BLUE SHIELD | Admitting: Neurology

## 2018-07-22 ENCOUNTER — Other Ambulatory Visit: Payer: Self-pay

## 2018-07-22 DIAGNOSIS — F0391 Unspecified dementia with behavioral disturbance: Secondary | ICD-10-CM

## 2018-07-22 DIAGNOSIS — R4701 Aphasia: Secondary | ICD-10-CM | POA: Diagnosis not present

## 2018-07-22 DIAGNOSIS — F03B18 Unspecified dementia, moderate, with other behavioral disturbance: Secondary | ICD-10-CM

## 2018-07-22 MED ORDER — CITALOPRAM HYDROBROMIDE 20 MG PO TABS: ORAL_TABLET | ORAL | 3 refills | Status: DC

## 2018-07-22 MED ORDER — DONEPEZIL HCL 10 MG PO TABS: 10.0000 mg | ORAL_TABLET | Freq: Every day | ORAL | 3 refills | Status: DC

## 2018-07-22 NOTE — Progress Notes (Signed)
Virtual Visit via Video Note The purpose of this virtual visit is to provide medical care while limiting exposure to the novel coronavirus.    Consent was obtained for video visit:  Yes.   Answered questions that patient had about telehealth interaction:  Yes.   I discussed the limitations, risks, security and privacy concerns of performing an evaluation and management service by telemedicine. I also discussed with the patient that there may be a patient responsible charge related to this service. The patient expressed understanding and agreed to proceed.  Pt location: Home Physician Location: office Name of referring provider:  No ref. provider found I connected with Sherry Scott at patients initiation/request on 07/22/2018 at  1:00 PM EDT by video enabled telemedicine application and verified that I am speaking with the correct person using two identifiers. Pt MRN:  [id number] Pt DOB:  [birth year]  History of Present Illness:  The patient was last seen in August 2019. Her daughter Sherry Scott provides information as the patient is having more aphasia. Since her last visit, Sherry Scott has noticed a huge decline in speech, even hand gestures are not making sense. She is not great with following instructions, she can get dressed but needs step-by-step instructions during bathing. Sometimes she has difficulties finding her spoon when eating cereal in the morning, she has more difficulty cutting meat, or how to pour milk or take it in and out of the fridge. She is forgetting to swallow, needing instructions to put medications in her mouth or to swallow water or spit when brushing teeth. She has more obsessive behaviors, her bed has to be made a certain way. She was "mean" to their niece one time. No paranoia or hallucinations. She had one panic attack when she woke up on the other side of the bed and her slippers were not on that side, she panicked and had a bad rest of the day. She is sleeping less, around 5-6  hours at night, no daytime napping any more. She does not wander, they have a yard fenced in. Her daughter stopped PT because she "does not try any more, she is not into it anymore." She denies any headaches, dizziness, vision changes, no falls. She was reporting near daily headaches 1-2 months ago, her daughter found out she was dehydrated and has increased fluid intake, she does not complain of headaches anymore. Her gait is slower, more cautious, hunched like walking on quick sand. She has some hand shakiness that has not changed since last visit.  History on Initial Assessment 06/02/2017: This is a pleasant 58 year old right-handed woman with a history of alcoholism, anxiety, depression, and dementia, presenting for a second opinion. Records from her neurologist Dr. Vickey Huger and recent Neuropsychological evaluation were reviewed. She states her memory is "sometimes good, sometimes not, mostly not." She states "I can't...verbalize things (with hesitation)." She is noted to have significant word-finding difficulties with non-fluent speech. She states "it's hard for me to umm tell" when asked about driving and getting lost. Her daughter, Sherry Scott, provides majority of the history. Sherry Scott started noticing cognitive changes in 2014 but brushed it off because she was heavily driving at that time. She was mean, aggressive, then would forget what she had said. She was living alone. Her friends started noticing changes in 2015 when she went to [facility] with them and was noted to repeat herself. Her daughter noticed that when they went on a trip together and she was not drinking, she was still displaying the  same symptoms. She got lost in her own neighborhood of 25 years. She had difficulty with bills and had not filed her taxes or renewed her car tags. Sherry Scott called the patient's sister to come help, and together they got her to see Dr. Vickey Huger in 2016. MMSE 21/30. Her daughter took over finances at that point. MRI brain in April  2016 showed mild diffuse and moderate perisylvian atrophy. EEG showed mild slowing. She was started on Aricept. She reportedly stopped drinking at that time and increased her nutrition, and family noticed a significant improvement where she was able to remember better, do serial 7s, draw a clock. MMSE noted on May 2016 visit was 22/30, Aricept dose increased. Per daughter, she improved to a point where she was cleared to return to driving. Her daughter called 6 months later to report changes since last visit but did not give details. Sherry Scott reports that she got lost driving in March 2017, taking a wrong turn. She asked for help and called her daughter, who took her back for evaluation with note of significant decline. Her MMSE in July 2017 was 19/30. In October 2017, MMSE was 17/30 and Sherry Scott was added. At that point she needed someone to help her on a daily basis. Her daughter had to start managing medications because she had difficulty taking BID dosing, taking medications for different days at one time. Symptoms seemed to have leveled out until the beginning of 2018 when she had more speech difficulties. A caregiver was coming a couple of times a day. Caregiver noted that she would keep getting the same things from the grocery. She apparently was still doing her laundry and dishes. On her May 2018 visit, she was noted to have word-finding difficulties, getting stuck mid-sentence. She was ataxic with handwriting changes. MMSE was 11/30. She had a repeat MRI brain in June 2018 which did not show any acute changes, there were nonspecific periventricular and subcortical white matter hyperintensities, mild degree of generalized cerebral atrophy which is age disproportionate. 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 Family was told she should not be on her own anymore, so her daughter bought a house and moved all of them in. Now that Sherry Scott sees her on a daily basis, she has noticed significant difficulties with simple tasks, she can't put away her clothes, turn on the TV, struggles pulling out a chair from the table and how to sit on it. Family sets out her breakfast, she can't find something if told to get it from the fridge. She cannot do laundry or dishes. Sherry Scott has to help her turn on the shower and stay close because she has developed a fear of falling. She can bathe herself but struggles with her clothes so her daughter picks them out for her.   Sherry Scott denies any paranoia or hallucinations. She was "a little on the mean side early on," but has softened up, "a little pointy sometimes." She felt appetite was not good on Namenda. She denies any headaches, dizziness, diplopia, dysarthria/dysphagia, neck/back pain, focal numbness/tingling/weakness, bowel/bladder dysfunction, anosmia. Her mother had memory issues. No history of head injuries. 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 She has had a mild tremor in both hands since December 2018. She had a fall recently, no significant injuries.     Observations/Objective:  Patient is awake, alert, oriented to person. She has expressive and receptive aphasia, with word-finding difficulties and paraphasic errors, she is able to answer with 1-word answers but cannot complete sentences. Difficulties following commands. Able to name pen, unable to name watch. Unable to repeat. Cranial nerves: Pupils equal, round. Extraocular movements intact with no nystagmus. Visual fields appear full to  gross confrontation testing by daughter but unable to show correct number of fingers. No facial asymmetry. Motor: moves all extremities symmetrically with no pronator drift. Appears to have more left hand apraxia, similar to prior visit. Gait: slightly wide-based, no ataxia, good arm swing. No tremors noted today.  Assessment and Plan:   This is a pleasant 58 yo RH woman with a history of alcoholism, sober since 2016, anxiety, depression, and early-onset dementia. She has a complicated history, it appears she had some cognitive improvement after stopping alcohol and did well for a few months, then started to have a more progressive decline where she started having significant word-finding difficulties.  Speech difficulties continue to progress, she has expressive>receptive aphasia today. MMSE 4/30. 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 She is on Donepezil 10mg  daily. We discussed increasing citalopram to 30mg  daily for obsessive behaviors/panic attacks/irritability. She has 24/7 care, her daughter would like more information with planning for the future as she progressively worsens. Information will be mailed for social worker support. She will follow-up in 6 months, family knows to call for any changes.   Follow Up Instructions:   -I discussed the assessment and treatment plan with the patient. The patient was provided an opportunity to ask questions and all were answered. The patient and family agreed with the plan and demonstrated an understanding of the instructions.   The patient/family were advised to call back or seek an in-person evaluation if the symptoms worsen or if the condition fails to improve as anticipated.  Total Time spent in visit with the patient was 30 minutes, of which more than 50% of the time was spent in counseling and/or coordinating  care on the above.   Pt understands and agrees with the plan of care outlined.     Van Clines, MD

## 2018-07-26 ENCOUNTER — Ambulatory Visit: Payer: BLUE CROSS/BLUE SHIELD | Admitting: Neurology

## 2018-12-14 ENCOUNTER — Telehealth: Payer: Self-pay | Admitting: Neurology

## 2018-12-14 NOTE — Telephone Encounter (Signed)
Daughter left msg wanting to check on mothers medication. She said she was starting to jerk and had anxiety. What could be done for her? Thanks!

## 2018-12-15 ENCOUNTER — Other Ambulatory Visit: Payer: Self-pay

## 2018-12-15 MED ORDER — BUSPIRONE HCL 5 MG PO TABS: 5.0000 mg | ORAL_TABLET | Freq: Two times a day (BID) | ORAL | 3 refills | Status: DC

## 2018-12-15 NOTE — Telephone Encounter (Signed)
Rarely citalopram can cause twitching and body jerks, I'm not sure this is what is causing her symptoms because we had increased it almost 6 months ago. In any case, let's reduce the citalopram back to 20mg  1 tab daily (currently I think she is taking 1.5 tablets daily). Pls ask daughter if she had taken Buspar (buspirone) in the past for anxiety, if not, we can start Buspar 5mg  BID, it's a low dose that we can increase if needed. Most common side effects can be dizziness and drowsiness. Pls have her update in 2 weeks and we can increase dose if tolerated. Thanks

## 2018-12-15 NOTE — Telephone Encounter (Signed)
Increased anxiety and jerks. Started about 2 weeks. Not everyday but getting worse. Afraid she may fall going downstairs to her apartment. Pt acts like something has scared her out of the blue.  Increased Citalopram at last visit. That is the only med change.  C/O loneliness more. Pt has on apartment downstairs. Coming upstairs wondering around more.  Daughter thinks she sleeps fine. Waking up earlier. Wakes up at 8am for since summer summer started. normally wakes around 10am.  [facility] [place]- [address]

## 2018-12-15 NOTE — Telephone Encounter (Signed)
Daughter Sherry Scott informed that Citalopram is not the likely cause of anxiety and jerks. She knows to decrease back to one tablet daily of Citalopram. Daughter ok to call in new Rx of Buspar 5mg  BID.  She was made aware of the side effects and to call back in 2 weeks with an update.  If pt tolerates Buspar can increase if needed. Daughter understood.

## 2019-01-11 ENCOUNTER — Other Ambulatory Visit: Payer: Self-pay | Admitting: Neurology

## 2019-02-21 ENCOUNTER — Ambulatory Visit: Payer: BLUE CROSS/BLUE SHIELD | Admitting: Neurology

## 2019-02-21 DIAGNOSIS — Z029 Encounter for administrative examinations, unspecified: Secondary | ICD-10-CM

## 2019-05-01 ENCOUNTER — Telehealth: Payer: Self-pay | Admitting: Neurology

## 2019-05-01 NOTE — Telephone Encounter (Signed)
Daughter is needing two different letters: one for the [employer] agent stating that patient is unable to communicate for herself. And then she is needing another letter for Medicaid for proof of disability. Please call her back at [phone]. Thanks!

## 2019-05-02 ENCOUNTER — Encounter: Payer: Self-pay | Admitting: Neurology

## 2019-05-02 NOTE — Telephone Encounter (Signed)
Dtr requesting this be mailed to the address on file in [place], [place], thanks!

## 2019-09-06 ENCOUNTER — Other Ambulatory Visit: Payer: Self-pay | Admitting: Neurology

## 2019-09-07 ENCOUNTER — Other Ambulatory Visit: Payer: Self-pay | Admitting: Neurology

## 2019-09-07 ENCOUNTER — Telehealth: Payer: Self-pay | Admitting: Neurology

## 2019-09-07 ENCOUNTER — Other Ambulatory Visit: Payer: Self-pay

## 2019-09-07 MED ORDER — DONEPEZIL HCL 10 MG PO TABS: 10.0000 mg | ORAL_TABLET | Freq: Every day | ORAL | 0 refills | Status: DC

## 2019-09-07 NOTE — Telephone Encounter (Signed)
Pt caregiver mary called informed that script for Aricept was called in.

## 2019-09-07 NOTE — Telephone Encounter (Signed)
Patients emergency contact called, made pt an appt for 10/10/19. Patient is out of Aricept 10mg , would like to know if patient can get it refilled? Please call.

## 2019-10-10 ENCOUNTER — Other Ambulatory Visit: Payer: Self-pay

## 2019-10-10 ENCOUNTER — Encounter: Payer: Self-pay | Admitting: Neurology

## 2019-10-10 ENCOUNTER — Ambulatory Visit (INDEPENDENT_AMBULATORY_CARE_PROVIDER_SITE_OTHER): Payer: BC Managed Care – PPO | Admitting: Neurology

## 2019-10-10 VITALS — BP 134/80 | HR 92 | Ht 68.0 in | Wt 134.0 lb

## 2019-10-10 DIAGNOSIS — F0391 Unspecified dementia with behavioral disturbance: Secondary | ICD-10-CM

## 2019-10-10 DIAGNOSIS — R4701 Aphasia: Secondary | ICD-10-CM

## 2019-10-10 DIAGNOSIS — F03B18 Unspecified dementia, moderate, with other behavioral disturbance: Secondary | ICD-10-CM

## 2019-10-10 MED ORDER — BUSPIRONE HCL 10 MG PO TABS: ORAL_TABLET | ORAL | 11 refills | Status: DC

## 2019-10-10 MED ORDER — CITALOPRAM HYDROBROMIDE 20 MG PO TABS: ORAL_TABLET | ORAL | 3 refills | Status: DC

## 2019-10-10 MED ORDER — DONEPEZIL HCL 10 MG PO TABS: 10.0000 mg | ORAL_TABLET | Freq: Every day | ORAL | 0 refills | Status: DC

## 2019-10-10 MED ORDER — DIVALPROEX SODIUM ER 250 MG PO TB24: ORAL_TABLET | ORAL | 11 refills | Status: DC

## 2019-10-10 NOTE — Patient Instructions (Signed)
1. Start Depakote ER 250mg : take 1 tablet every night for 1 week, then increase to 2 tablets every night  2. Increase Buspar to 10mg  in morning and lunch time  3. Continue Citalopram 20mg  daily   4. Continue Donepezil 10mg  daily  5. Let know if PCP visit will be further away and we will order EKG instead  6. Continue 24/7 care, follow-up in 6 months, call for any changes  FALL PRECAUTIONS: Be cautious when walking. Scan the area for obstacles that may increase the risk of trips and falls. When getting up in the mornings, sit up at the edge of the bed for a few minutes before getting out of bed. Consider elevating the bed at the head end to avoid drop of blood pressure when getting up. Walk always in a well-lit room (use night lights in the walls). Avoid area rugs or power cords from appliances in the middle of the walkways. Use a walker or a cane if necessary and consider physical therapy for balance exercise. Get your eyesight checked regularly.   HOME SAFETY: Consider the safety of the kitchen when operating appliances like stoves, microwave oven, and blender. Consider having supervision and share cooking responsibilities until no longer able to participate in those. Accidents with firearms and other hazards in the house should be identified and addressed as well.  ABILITY TO BE LEFT ALONE: If patient is unable to contact 911 operator, consider using LifeLine, or when the need is there, arrange for someone to stay with patients. Smoking is a fire hazard, consider supervision or cessation. Risk of wandering should be assessed by caregiver and if detected at any point, supervision and safe proof recommendations should be instituted.   RECOMMENDATIONS FOR ALL PATIENTS WITH MEMORY PROBLEMS: 1. Continue to exercise (Recommend 30 minutes of walking everyday, or 3 hours every week) 2. Increase social interactions - continue going to [place] and enjoy social gatherings with friends and family 3.  Eat healthy, avoid fried foods and eat more fruits and vegetables 4. Maintain adequate blood pressure, blood sugar, and blood cholesterol level. Reducing the risk of stroke and cardiovascular disease also helps promoting better memory. 5. Avoid stressful situations. Live a simple life and avoid aggravations. Organize your time and prepare for the next day in anticipation. 6. Sleep well, avoid any interruptions of sleep and avoid any distractions in the bedroom that may interfere with adequate sleep quality 7. Avoid sugar, avoid sweets as there is a strong link between excessive sugar intake, diabetes, and cognitive impairment The Mediterranean diet has been shown to help patients reduce the risk of progressive memory disorders and reduces cardiovascular risk. This includes eating fish, eat fruits and green leafy vegetables, nuts like almonds and hazelnuts, walnuts, and also use olive oil. Avoid fast foods and fried foods as much as possible. Avoid sweets and sugar as sugar use has been linked to worsening of memory function.  There is always a concern of gradual progression of memory problems. If this is the case, then we may need to adjust level of care according to patient needs. Support, both to the patient and caregiver, should then be put into place.

## 2019-10-10 NOTE — Progress Notes (Signed)
NEUROLOGY FOLLOW UP OFFICE NOTE  CANARY FISTER [id number] Sep 03, 1960  HISTORY OF PRESENT ILLNESS: I had the pleasure of seeing Sherry Scott in follow-up in the neurology clinic on 10/10/2019.  The patient was last seen over a year ago for dementia. She is again accompanied by her daughter [name] who helps supplement the history today. Since her last visit, she has had continued progressive decline. 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 She is total assist with all her ADLs, hygiene has been challenging. She has a Comptroller 3 days a week. She has lost 40 lbs in the past 6 months despite having a great appetite. She has dropped an entire pant size. She has also had several UTIs in the past 6 months. Behaviors have worsened, she gets frustrated easily with her daughter. Her anxiety has been very bad, "all the time." She has difficulty sleeping, awake at 3AM, walking around in her downstairs apartment. [name] reports debatable hallucinations, difficult to determine due to her aphasia. She has also developed myoclonus. [name] was concerned this was due to increase in citalopram dose, despite reduction back to 20mg  daily, myoclonic continues. She is also on Donepezil 10mg  daily and Buspar 5mg  BID.    History on Initial Assessment 06/02/2017: This is a pleasant 59 year old right-handed woman with a history of alcoholism, anxiety, depression, and dementia, presenting for a second opinion. Records from her neurologist Dr. and recent Neuropsychological evaluation were reviewed. She states her memory is "sometimes good, sometimes not, mostly not." She states "I can't...verbalize things (with hesitation)." She is noted to have significant word-finding difficulties with non-fluent speech. She states "it's hard for me to umm tell" when asked about driving and getting lost. Her daughter, 07/31/2017, provides majority of the history. 44 started noticing cognitive changes in 2014 but brushed it off because she was  heavily driving at that time. She was mean, aggressive, then would forget what she had said. She was living alone. Her friends started noticing changes in 2015 when she went to [facility] with them and was noted to repeat herself. Her daughter noticed that when they went on a trip together and she was not drinking, she was still displaying the same symptoms. She got lost in her own neighborhood of 25 years. She had difficulty with bills and had not filed her taxes or renewed her car tags. 2015 called the patient's sister to come help, and together they got her to see Dr. 2016 in 2016. MMSE 21/30. Her daughter took over finances at that point. MRI brain in April 2016 showed mild diffuse and moderate perisylvian atrophy. EEG showed mild slowing. She was started on Aricept. She reportedly stopped drinking at that time and increased her nutrition, and family noticed a significant improvement where she was able to remember better, do serial 7s, draw a clock. MMSE noted on May 2016 visit was 22/30, Aricept dose increased. Per daughter, she improved to a point where she was cleared to return to driving. Her daughter called 6 months later to report changes since last visit but did not give details. 2017 reports that she got lost driving in March 2017, taking a wrong turn. She asked for help and called her daughter, who took her back for evaluation with note of significant decline. Her MMSE in July 2017 was 19/30. In October 2017, MMSE was 17/30 and 06-17-2006 was added. At that point she needed someone to help her on a daily basis. Her daughter had to start managing medications because  she had difficulty taking BID dosing, taking medications for different days at one time. Symptoms seemed to have leveled out until the beginning of 2018 when she had more speech difficulties. A caregiver was coming a couple of times a day. Caregiver noted that she would keep getting the same things from the grocery. She apparently was still  doing her laundry and dishes. On her May 2018 visit, she was noted to have word-finding difficulties, getting stuck mid-sentence. She was ataxic with handwriting changes. MMSE was 11/30. She had a repeat MRI brain in June 2018 which did not show any acute changes, there were nonspecific periventricular and subcortical white matter hyperintensities, mild degree of generalized cerebral atrophy which is age disproportionate. 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 Family was told she should not be on her own anymore, so her daughter bought a house and moved all of them in. Now that Stanton Kidney sees her on a daily basis, she has noticed significant difficulties with simple tasks, she can't put away her clothes, turn on the TV, struggles pulling out a chair from the table and how to sit on it. Family sets out her breakfast, she can't find something if told to get it from the fridge. She cannot do laundry or dishes. Stanton Kidney has to help her turn on the shower and stay close because she has developed a fear of falling. She can bathe herself but struggles with her clothes so her daughter picks them out for her.   Stanton Kidney denies any paranoia or hallucinations. She was "a little on the mean side early on," but has softened up, "a little pointy sometimes." She felt appetite was not good on Namenda. She denies any headaches, dizziness, diplopia, dysarthria/dysphagia, neck/back pain, focal numbness/tingling/weakness, bowel/bladder dysfunction, anosmia. Her mother had memory issues. No history of head injuries. 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 She has had a mild tremor in both hands since December 2018. She had a fall recently, no significant injuries.    PAST MEDICAL HISTORY: Past Medical History:  Diagnosis Date  . Alcoholism associated with dementia ([place]) 07/25/2014  . Anxiety   . Depression   . High triglycerides   . 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 busPIRone (BUSPAR) 5 MG tablet TAKE 1 TABLET BY MOUTH TWICE A DAY 180 tablet 2  . citalopram (CELEXA) 20 MG tablet Take 1.5 tablets daily (Patient taking differently: Take 1 tablets daily) 135 tablet 3  . donepezil (ARICEPT) 10 MG tablet Take 1 tablet (10 mg total) by mouth daily. 90 tablet 0   Current Facility-Administered Medications on File Prior to Visit  Medication Dose Route Frequency Provider Last Rate Last Admin  . gadopentetate dimeglumine (MAGNEVIST) injection 15 mL  15 mL Intravenous Once PRN Dohmeier, Asencion Partridge, MD        ALLERGIES: No Known Allergies  FAMILY HISTORY: Family History  Problem Relation Age of Onset  . Breast cancer Mother   . Lung cancer Father  SOCIAL HISTORY: Social History   Socioeconomic History  . Marital status: Single    Spouse name: Not on file  . Number of children: 1  . Years of education: 2y coll  . Highest education level: Not on file  Occupational History  . Occupation: retired  Tobacco Use  . Smoking status: Former Smoker    Packs/day: 0.50    Types: Cigarettes  . Smokeless tobacco: Never Used  Vaping Use  . Vaping Use: Every day  . Substances: Nicotine  Substance and Sexual Activity  . Alcohol use: No    Alcohol/week: 0.0 standard drinks    Comment: 3wks sober   . Drug use: No  . Sexual activity: Not on file  Other Topics Concern  . Not on file  Social History  Narrative   Pt lives in 2 story home with her daughter and her daughter's wife   Has 1 adult child   Some college   Worked in Furniture conservator/restorer   Right handed    Social Determinants of Health   Financial Resource Strain:   . Difficulty of Paying Living Expenses:   Food Insecurity:   . Worried About Programme researcher, broadcasting/film/video in the Last Year:   . Barista in the Last Year:   Transportation Needs:   . Freight forwarder (Medical):   Marland Kitchen Lack of Transportation (Non-Medical):   Physical Activity:   . Days of Exercise per Week:   . Minutes of Exercise per Session:   Stress:   . Feeling of Stress :   Social Connections:   . Frequency of Communication with Friends and Family:   . Frequency of Social Gatherings with Friends and Family:   . Attends Religious Services:   . Active Member of Clubs or Organizations:   . Attends Banker Meetings:   Marland Kitchen Marital Status:   Intimate Partner Violence:   . Fear of Current or Ex-Partner:   . Emotionally Abused:   Marland Kitchen Physically Abused:   . Sexually Abused:     REVIEW OF SYSTEMS unable to obtain due to dementia  PHYSICAL EXAM: Vitals:   10/10/19 1557  BP: 134/80  Pulse: 92  SpO2: 96%   General: No acute distress Head:  Normocephalic/atraumatic Skin/Extremities: No rash, no edema Neurological Exam: alert and oriented to person. She has expressive and receptive aphasia. She is attentive but has difficulty following commands. Able to name "pen," unable to name "button," unable to repeat. Cranial nerves: Pupils equal, round, reactive to light. Extraocular movements intact with no nystagmus. Visual fields full but appears to have left-sided neglect. No facial asymmetry.  Motor: Bulk and tone normal, muscle strength 5/5 throughout with no pronator drift.  Finger to nose testing intact.  Gait ambulates with left hand flexed.    IMPRESSION: This is a pleasant 59 yo RH woman with a history of alcoholism, sober since 2016, anxiety,  depression, and early-onset dementia. She has a complicated history, it appears she had some cognitive improvement after stopping alcohol and did well for a few months, then started to have a more progressive decline where she started having significant word-finding difficulties. Aphasia has progressively worsened, there also appears to be left-sided neglect. She is now dependent with all ADLs. 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 We discussed starting Depakote ER 250mg  qhs x 1 week, then increase to 500mg  qhs for myoclonus. Side  effects discused. She is anxious all the time, increase Buspar to 10mg  BID. Continue citalopram 20mg  daily and Donepezil 10mg  daily. Check EKG. Continue 24/7 care, follow-up in 6 months, they know to call for any changes.   Thank you for allowing me to participate in her care.  Please do not hesitate to call for any questions or concerns.   , M.D.   CC: Dr. 

## 2019-11-01 ENCOUNTER — Other Ambulatory Visit: Payer: Self-pay | Admitting: Neurology

## 2019-12-06 ENCOUNTER — Other Ambulatory Visit: Payer: Self-pay | Admitting: Neurology

## 2020-01-26 ENCOUNTER — Telehealth: Payer: Self-pay | Admitting: Neurology

## 2020-01-26 NOTE — Telephone Encounter (Signed)
Patient sent the following correspondence through MyChart. Routing to triage to assist patient with request.  Message:  Good day!  I've printed the FMLA forms for Dr. Karel Jarvis to complete. I'll forward your message as well.  There is a $25.00 fee for form completion that will need to be paid before the forms are processed. That may be paid at any time between now and completion of the forms by calling our office at: [phone].   We look forward to hearing from you.  Thank you,  [name] at [facility]   RE:  Fax Number: [phone]   Sherry Scott on the phone with the patient coordinator now. I need assistance in filling out the FMLA form because while it was approved, I requested something incorrectly. The goal is to get my job to allow me to have flexibility to start work AFTER I've provided all of my mom's care in the morning (~2-3 hours every day and time off for Social Worker visits/Social Security/Medicaid/etc.) because it has become quite a task. I think I've filled out the attached correctly, but I'm still unsure and at this point (all the forms are blurring together). I'm so sorry for all of the whirlwind and I'm always grateful for you all.  Thank you,  Sherry Scott

## 2020-01-29 ENCOUNTER — Other Ambulatory Visit: Payer: Self-pay | Admitting: Neurology

## 2020-01-30 ENCOUNTER — Other Ambulatory Visit: Payer: Self-pay

## 2020-01-30 ENCOUNTER — Encounter: Payer: Self-pay | Admitting: Neurology

## 2020-01-30 ENCOUNTER — Ambulatory Visit (INDEPENDENT_AMBULATORY_CARE_PROVIDER_SITE_OTHER): Payer: Self-pay | Admitting: Neurology

## 2020-01-30 VITALS — Ht 67.0 in | Wt 133.0 lb

## 2020-01-30 DIAGNOSIS — F03B18 Unspecified dementia, moderate, with other behavioral disturbance: Secondary | ICD-10-CM

## 2020-01-30 DIAGNOSIS — F0391 Unspecified dementia with behavioral disturbance: Secondary | ICD-10-CM

## 2020-01-30 DIAGNOSIS — Z0279 Encounter for issue of other medical certificate: Secondary | ICD-10-CM

## 2020-01-30 MED ORDER — BUSPIRONE HCL 10 MG PO TABS: ORAL_TABLET | ORAL | 3 refills | Status: DC

## 2020-01-30 MED ORDER — DIVALPROEX SODIUM ER 250 MG PO TB24: ORAL_TABLET | ORAL | 3 refills | Status: DC

## 2020-01-30 NOTE — Telephone Encounter (Signed)
Done

## 2020-01-30 NOTE — Progress Notes (Signed)
Virtual Visit via Video Note The purpose of this virtual visit is to provide medical care while limiting exposure to the novel coronavirus.    Consent was obtained for video visit:  Yes.   Answered questions that patient had about telehealth interaction:  Yes.   I discussed the limitations, risks, security and privacy concerns of performing an evaluation and management service by telemedicine. I also discussed with the patient that there may be a patient responsible charge related to this service. The patient expressed understanding and agreed to proceed.  Pt location: Home Physician Location: office Name of referring provider:  No ref. provider found I connected with Sherry Scott at patients initiation/request on 01/30/2020 at  3:00 PM EDT by video enabled telemedicine application and verified that I am speaking with the correct person using two identifiers. Pt MRN:  [id number] Pt DOB:  January 18, 1961 Video Participants:  Sherry Scott;  Hilton Cork (daughter)   History of Present Illness:  The patient had a virtual video visit on 01/30/2020. She was last seen in the neurology clinic 4 months ago for dementia. Her daughter [name] is present to provide additional information. Since her last visit, [name] reports continued progressive decline in the past 30 days, to the point that she is wandering and awake at night. Her PCP started Trazodone 50mg  qhs which has helped significantly, she now sleeps through the night. On her last visit, she was also noted to have myoclonus and was started on Depakote ER 500mg  qhs. reports a significant improvement in myoclonus, no side effects. She continues to have a lot of anxiety, had to increase dose of Buspar 10mg  to TID. She is also on Citalopram 20mg  daily. Staying on this regimen helps with anxiety. She is on Donepezil 10mg  daily without side effects. She is able to feed herself, but otherwise requires assistance with all ADLs. [name] reports the morning  routine is cumbersome so she has needed to start her work at a later time to get her mother settled. She is having more hallucinations, talking to the walls at night. She has applications for PACE assistance and Medicaid. She denies any headaches, dizziness, focal numbness/tingling/weakness. Speech is nonsensical, she is able to say yes or no, says that she falls, but unable to elaborate or describe the fall. She was previously having a lot of weight loss, this has stabilized. She was found to have a mass on her clavicle and will be undergoing more tests.    History on Initial Assessment 06/02/2017: This is a pleasant 58 year old right-handed woman with a history of alcoholism, anxiety, depression, and dementia, presenting for a second opinion. Records from her neurologist Dr. and recent Neuropsychological evaluation were reviewed. She states her memory is "sometimes good, sometimes not, mostly not." She states "I can't...verbalize things (with hesitation)." She is noted to have significant word-finding difficulties with non-fluent speech. She states "it's hard for me to umm tell" when asked about driving and getting lost. Her daughter, , provides majority of the history. [name] started noticing cognitive changes in 2014 but brushed it off because she was heavily driving at that time. She was mean, aggressive, then would forget what she had said. She was living alone. Her friends started noticing changes in 2015 when she went to [facility] with them and was noted to repeat herself. Her daughter noticed that when they went on a trip together and she was not drinking, she was still displaying the same symptoms. She got  lost in her own neighborhood of 25 years. She had difficulty with bills and had not filed her taxes or renewed her car tags. [name] called the patient's sister to come help, and together they got her to see Dr. Vickey Huger in 2016. MMSE 21/30. Her daughter took over finances at that point. MRI brain  in April 2016 showed mild diffuse and moderate perisylvian atrophy. EEG showed mild slowing. She was started on Aricept. She reportedly stopped drinking at that time and increased her nutrition, and family noticed a significant improvement where she was able to remember better, do serial 7s, draw a clock. MMSE noted on May 2016 visit was 22/30, Aricept dose increased. Per daughter, she improved to a point where she was cleared to return to driving. Her daughter called 6 months later to report changes since last visit but did not give details. [name] reports that she got lost driving in March 2017, taking a wrong turn. She asked for help and called her daughter, who took her back for evaluation with note of significant decline. Her MMSE in July 2017 was 19/30. In October 2017, MMSE was 17/30 and [name] was added. At that point she needed someone to help her on a daily basis. Her daughter had to start managing medications because she had difficulty taking BID dosing, taking medications for different days at one time. Symptoms seemed to have leveled out until the beginning of 2018 when she had more speech difficulties. A caregiver was coming a couple of times a day. Caregiver noted that she would keep getting the same things from the grocery. She apparently was still doing her laundry and dishes. On her May 2018 visit, she was noted to have word-finding difficulties, getting stuck mid-sentence. She was ataxic with handwriting changes. MMSE was 11/30. She had a repeat MRI brain in June 2018 which did not show any acute changes, there were nonspecific periventricular and subcortical white matter hyperintensities, mild degree of generalized cerebral atrophy which is age disproportionate. 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 Family was told she should not be on her own anymore, so her daughter bought a house and moved all of them in. Now that [name] sees her on a daily basis, she has noticed significant difficulties with simple tasks, she can't put away her clothes, turn on the TV, struggles pulling out a chair from the table and how to sit on it. Family sets out her breakfast, she can't find something if told to get it from the fridge. She cannot do laundry or dishes. [name] has to help her turn on the shower and stay close because she has developed a fear of falling. She can bathe herself but struggles with her clothes so her daughter picks them out for her.   [name] denies any paranoia or hallucinations. She was "a little on the mean side early on," but has softened up, "a little pointy sometimes." She felt appetite was not good on Namenda. She denies any headaches, dizziness, diplopia, dysarthria/dysphagia, neck/back pain, focal numbness/tingling/weakness, bowel/bladder dysfunction, anosmia. Her mother had memory issues. No history of head injuries. 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 She has had a mild tremor in both hands since December 2018. She had a fall recently, no significant injuries.     Current Outpatient Medications on File Prior to Visit  Medication Sig Dispense Refill  . busPIRone (BUSPAR) 10 MG tablet TAKE 1 TABLET IN AM AND 1 TABLET AT NOON 180 tablet 1  . citalopram (CELEXA) 20 MG tablet Take 1 tablets daily 135 tablet 3  . divalproex (DEPAKOTE ER) 250 MG 24 hr tablet Take 1 tablet every night for 1 week, then increase to 2 tablets every night 60 tablet 11  . donepezil (ARICEPT) 10 MG tablet Take  1 tablet (10 mg total) by mouth daily. 90 tablet 0  . traZODone (DESYREL) 50 MG tablet Take by mouth at bedtime.     Current Facility-Administered Medications on File Prior to Visit  Medication Dose Route Frequency Provider Last Rate Last Admin  . gadopentetate dimeglumine (MAGNEVIST) injection 15 mL  15 mL Intravenous Once PRN Dohmeier, Porfirio Mylar, MD         Observations/Objective:   Vitals:   01/30/20 1433  Weight: 133 lb (60.3 kg)  Height: 5\' 7"  (1.702 m)   GEN:  The patient appears stated age and is in NAD.  Neurological examination: Patient is awake, alert, oriented to person. She again has expressive and receptive aphasia, with nonsensical speech. She is able to name "pen" and "cup" with prompting. Able to follow simple commands. Cranial nerves: Extraocular movements intact with no nystagmus. No facial asymmetry. Motor: moves all extremities symmetrically, at least anti-gravity x 4. No incoordination on finger to nose testing. No clear myoclonic jerks seen on video today.    Assessment and Plan:   This is a pleasant 59 yo RH woman with a history of alcoholism, sober since 2016, anxiety, depression, and early-onset dementia. She has a complicated history, it appears she had some cognitive improvement after stopping alcohol and did well for a few months, then started to have a more progressive decline where she started having significant word-finding difficulties. Aphasia has progressively worsened, she needs assistance with all ADLs. She was also having more sleep issues with good response to Trazodone 50mg  qhs. There is significant anxiety, she is on Buspar 10mg  TID and Citalopram 20mg  daily. Depakote ER 500mg  qhs has helped with myoclonic jerks. Continue Donepezil 10mg  daily. Continue 24/7 care, 2017 would like to keep her at home as long as possible. Follow-up in 6 months, they know to call for any changes.    Follow Up Instructions:   -I discussed the assessment and treatment plan  with the patient. The patient was provided an opportunity to ask questions and all were answered. The patient agreed with the plan and demonstrated an understanding of the instructions.   The patient was advised to call back or seek an in-person evaluation if the symptoms worsen or if the condition fails to improve as anticipated.    , MD  CC: Dr. 

## 2020-02-12 ENCOUNTER — Other Ambulatory Visit: Payer: Self-pay | Admitting: Neurology

## 2020-05-13 ENCOUNTER — Ambulatory Visit: Payer: BC Managed Care – PPO | Admitting: Neurology

## 2020-05-28 ENCOUNTER — Other Ambulatory Visit: Payer: Self-pay | Admitting: Neurology

## 2020-08-06 ENCOUNTER — Other Ambulatory Visit (HOSPITAL_COMMUNITY): Payer: Self-pay | Admitting: Surgery

## 2020-08-06 ENCOUNTER — Other Ambulatory Visit: Payer: Self-pay | Admitting: Surgery

## 2020-08-06 DIAGNOSIS — R221 Localized swelling, mass and lump, neck: Secondary | ICD-10-CM

## 2020-08-08 ENCOUNTER — Other Ambulatory Visit: Payer: Self-pay

## 2020-08-08 ENCOUNTER — Ambulatory Visit (HOSPITAL_COMMUNITY)
Admission: RE | Admit: 2020-08-08 | Discharge: 2020-08-08 | Disposition: A | Discharge status: Home / Self Care | Payer: Medicaid Other | Source: Ambulatory Visit | Attending: Surgery | Admitting: Surgery

## 2020-08-08 DIAGNOSIS — R221 Localized swelling, mass and lump, neck: Secondary | ICD-10-CM | POA: Diagnosis present

## 2020-08-28 ENCOUNTER — Other Ambulatory Visit: Payer: Self-pay | Admitting: Neurology

## 2020-09-16 ENCOUNTER — Ambulatory Visit: Payer: Medicaid Other | Admitting: Neurology

## 2020-09-16 ENCOUNTER — Other Ambulatory Visit: Payer: Self-pay

## 2020-09-16 ENCOUNTER — Encounter: Payer: Self-pay | Admitting: Neurology

## 2020-09-16 VITALS — BP 107/69 | HR 79 | Ht 67.0 in | Wt 122.6 lb

## 2020-09-16 DIAGNOSIS — F0391 Unspecified dementia with behavioral disturbance: Secondary | ICD-10-CM

## 2020-09-16 DIAGNOSIS — F03B18 Unspecified dementia, moderate, with other behavioral disturbance: Secondary | ICD-10-CM

## 2020-09-16 MED ORDER — DONEPEZIL HCL 10 MG PO TABS: 10.0000 mg | ORAL_TABLET | Freq: Every day | ORAL | 3 refills | Status: AC

## 2020-09-16 MED ORDER — BUSPIRONE HCL 10 MG PO TABS: ORAL_TABLET | ORAL | 3 refills | Status: AC

## 2020-09-16 MED ORDER — DIVALPROEX SODIUM ER 250 MG PO TB24: ORAL_TABLET | ORAL | 3 refills | Status: AC

## 2020-09-16 MED ORDER — CITALOPRAM HYDROBROMIDE 20 MG PO TABS: ORAL_TABLET | ORAL | 3 refills | Status: AC

## 2020-09-16 NOTE — Patient Instructions (Signed)
1. Increase the Depakote 250mg : take 2 tablets every morning  2. Continue all your other medications  3. Continue 24/7 care  4. Follow-up in 6 months, call for any changes

## 2020-09-16 NOTE — Progress Notes (Signed)
NEUROLOGY FOLLOW UP OFFICE NOTE  Sherry Scott [id number] 1960-10-07  HISTORY OF PRESENT ILLNESS: I had the pleasure of seeing Sherry Scott in follow-up in the neurology clinic on 09/16/2020.  The patient was last seen 7 months ago for early onset dementia. She is again accompanied by her daughter Sherry Scott who provides history today as patient is progressively more aphasic. Since her last visit, there has been continued decline. She requires assistance with all ADLs, including brushing her teeth. She can only eat finger foods and would easy messy with a spoon but feed herself. She wears Depends and has accidents sometimes. She was previously having myoclonus with excellent response to Depakote. Depakote seemed to be causing mor insomnia and she has having mild skin rashes so Sherry Scott stopped Depakote, however she started having myoclonus again. Depakote 250mg  restarted at 1 tab every morning with resolution of myoclonus. Increasing Trazodone to 150mg  qhs seems to have helped with sleep, but there are still times she wanders at night. Sherry Scott reports she is having increasing difficulty articulating, but is having halluciantions, pointing and saying "it's coming out" and getting agitated. This is increasing and occurring daily. She has a mass on her right neck, unclear etiology, and continues to follow-up with her surgeon. She continues on Donepezil 10mg  daily, Buspar 10mg  TID, and Citalopram 20mg  daily without side effects. Sherry Scott now has 27 hours of help with patient getting on Medicaid, but needs more like 40 hours of help at home. She has expressive aphasia, answers yes and no to ROS questions, denying any headaches, dizziness, no falls.   History on Initial Assessment 06/02/2017: This is a pleasant 60 year old right-handed woman with a history of alcoholism, anxiety, depression, and dementia, presenting for a second opinion. Records from her neurologist Dr. Vickey Hugerohmeier and recent Neuropsychological evaluation were  reviewed. She states her memory is "sometimes good, sometimes not, mostly not." She states "I can't...verbalize things (with hesitation)." She is noted to have significant word-finding difficulties with non-fluent speech. She states "it's hard for me to umm tell" when asked about driving and getting lost. Her daughter, Sherry Scott, provides majority of the history. Sherry Scott started noticing cognitive changes in 2014 but brushed it off because she was heavily driving at that time. She was mean, aggressive, then would forget what she had said. She was living alone. Her friends started noticing changes [facility] and was noted to repeat herself. Her daughter noticed that when they went on a trip together and she was not drinking, she was still displaying the same symptoms. She got lost in her own neighborhood of 25 years. She had difficulty with bills and had not filed her taxes or renewed her car tags. Sherry Scott called the patient's sister to come help, and together they got her to see Dr. Vickey Hugerohmeier in 2016. MMSE 21/30. Her daughter took over finances at that point. MRI brain in April 2016 showed mild diffuse and moderate perisylvian atrophy. EEG showed mild slowing. She was started on Aricept. She reportedly stopped drinking at that time and increased her nutrition, and family noticed a significant improvement where she was able to remember better, do serial 7s, draw a clock. MMSE noted on May 2016 visit was 22/30, Aricept dose increased. Per daughter, she improved to a point where she was cleared to return to driving. Her daughter called 6 months later to report changes since last visit but did not give details. Sherry Scott reports that she got lost driving  in March 2017, taking a wrong turn. She asked for help and called her daughter, who took her back for evaluation with note of significant decline. Her MMSE in July 2017 was 19/30. In October 2017, MMSE was 17/30 and [name] was added. At that point she  needed someone to help her on a daily basis. Her daughter had to start managing medications because she had difficulty taking BID dosing, taking medications for different days at one time. Symptoms seemed to have leveled out until the beginning of 2018 when she had more speech difficulties. A caregiver was coming a couple of times a day. Caregiver noted that she would keep getting the same things from the grocery. She apparently was still doing her laundry and dishes. On her May 2018 visit, she was noted to have word-finding difficulties, getting stuck mid-sentence. She was ataxic with handwriting changes. MMSE was 11/30. She had a repeat MRI brain in June 2018 which did not show any acute changes, there were nonspecific periventricular and subcortical white matter hyperintensities, mild degree of generalized cerebral atrophy which is age disproportionate. 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 Family was told she should not be on her own anymore, so her daughter bought a house and moved all of them in. Now that Sherry Dandy sees her on a daily basis, she has noticed significant difficulties with simple tasks, she can't put away her clothes, turn on the TV, struggles pulling out a chair from the table and how to sit on it. Family sets out her breakfast, she can't find something if told to get it from the fridge. She cannot do laundry or dishes. Sherry Dandy has to help her turn on the shower and stay close because she has developed a fear of falling. She can bathe herself but struggles with her clothes so her  daughter picks them out for her.   Sherry Dandy denies any paranoia or hallucinations. She was "a little on the mean side early on," but has softened up, "a little pointy sometimes." She felt appetite was not good on Namenda. She denies any headaches, dizziness, diplopia, dysarthria/dysphagia, neck/back pain, focal numbness/tingling/weakness, bowel/bladder dysfunction, anosmia. Her mother had memory issues. No history of head injuries. 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 She has had a mild tremor in both hands since December 2018. She had a fall recently, no significant injuries.    PAST MEDICAL HISTORY: Past Medical History:  Diagnosis Date  . 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 Anxiety   . Colitis    sept 12  . Depression   . High triglycerides   . 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 busPIRone (BUSPAR) 10 MG tablet Take 1 tablet three times a day 270 tablet 3  . citalopram (CELEXA) 20 MG tablet Take 1 tablets daily 135 tablet 3  . divalproex (DEPAKOTE ER) 250 MG 24 hr tablet Take 2 tablets every night (Patient taking differently: Take 1 tablet in the am) 180 tablet  3  . donepezil (ARICEPT) 10 MG tablet TAKE 1 TABLET BY MOUTH EVERY DAY 30 tablet 0  . traZODone (DESYREL) 50 MG tablet Take 150 mg by mouth at bedtime. 150 mg ( 3 tablets at bed time)     ALLERGIES: No Known Allergies  FAMILY HISTORY: Family History  Problem Relation Age of Onset  . Breast cancer Mother   . Lung cancer Father     SOCIAL HISTORY: Social History   Socioeconomic History  . Marital status: Single    Spouse name: Not on file  . Number of children: 1  . Years of education: 2y coll  . Highest education level: Not on file  Occupational History  . Occupation: retired  Tobacco  Use  . Smoking status: Former Smoker    Packs/day: 0.50    Types: Cigarettes  . Smokeless tobacco: Never Used  Vaping Use  . Vaping Use: Every day  . Substances: Nicotine  Substance and Sexual Activity  . Alcohol use: No    Alcohol/week: 0.0 standard drinks    Comment: 3wks sober   . Drug use: No  . Sexual activity: Not on file  Other Topics Concern  . Not on file  Social History Narrative   Pt lives in 2 story home with her daughter and her daughter's wife   Has 1 adult child   Some college   Worked in Furniture conservator/restorer   Right handed    Social Determinants of Health   Financial Resource Strain: Not on file  Food Insecurity: Not on file  Transportation Needs: Not on file  Physical Activity: Not on file  Stress: Not on file  Social Connections: Not on file  Intimate Partner Violence: Not on file     PHYSICAL EXAM: Vitals:   09/16/20 1136  BP: 107/69  Pulse: 79  SpO2: (!) 79%   General: No acute distress Head:  Normocephalic/atraumatic Skin/Extremities: No rash, no edema Neurological Exam: alert and oriented to person. She has expressive and receptive aphasia, able to answer yes/no but otherwise reduced fluency and comprehension, difficulty following instructions. Fund of knowledge is reduced. Attention and concentration are reduced. Cranial nerves: Pupils equal, round. Extraocular movements intact with no nystagmus. Visual fields full but with difficulty counting fingers consistently.  No facial asymmetry.  Motor: Bulk and tone normal, muscle strength 5/5 throughout with no pronator drift.   Finger to nose testing intact.  Gait narrow-based and steady, no ataxia. Mild bilateral postural tremor, no resting tremor. No myoclonic jerks.    IMPRESSION: This is a pleasant 60 yo RH woman with a history of alcoholism, sober since 2016, anxiety, depression, and early-onset dementia. She has a complicated history, it appears she had some cognitive improvement after stopping alcohol  and did well for a few months, then started to have a more progressive decline where she started having significant word-finding difficulties. 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 She has had continued decline with worsening aphasia and requiring 24/7 care. Continue Donepezil 10mg  daily. She is having more hallucinations, increase Depakote to 500mg  daily. We discussed Seroquel and cardiac black box warning in patients with dementia, and have agreed to hold off for now. Continue Citalopram 20mg  daily, Buspar 10mg  TID. She is also on Trazodone 150mg  qhs for sleep. Follow-up in 6 months, call for any changes.    Thank you for allowing me to participate in her care.  Please do not hesitate to call for  any questions or concerns.  Patrcia Dolly, M.D.   CC: Dalbert Mayotte, [place]

## 2020-09-25 ENCOUNTER — Telehealth: Payer: Self-pay

## 2020-09-25 NOTE — Telephone Encounter (Signed)
Pt daughter called and informed that FL2 form was ready for her to come by and pick up

## 2020-09-26 ENCOUNTER — Telehealth: Payer: Self-pay | Admitting: Counselor

## 2020-09-26 NOTE — Telephone Encounter (Signed)
Patient's daughter called with concerns about needing a different FL2 Form, she needs a skilled nursing FL2 instead.   Please call so she can over this with a nurse.  She will send it through MyChart.

## 2020-09-26 NOTE — Telephone Encounter (Signed)
I routed form to Dr.Aquino.

## 2021-03-23 NOTE — Progress Notes (Incomplete)
Assessment/Plan:    Dementia  [facility] today is    Recommendations:  Discussed safety both in and out of the home.  Discussed the importance of regular daily schedule with inclusion of crossword puzzles to maintain brain function.  Continue to monitor mood by PCP Stay active at least 30 minutes at least 3 times a week.  Naps should be scheduled and should be no longer than 60 minutes and should not occur after 2 PM.  Mediterranean diet is recommended  Continue donepezil 10 mg daily Side effects were discussed Follow up in   months.   Case discussed with Dr. Karel Jarvis who agrees with the plan     Subjective:   ED visits since last seen: none  Hospital admissions: none  Sherry Scott is a 60 y.o. female  She was last seen on 09/16/20. seen today in follow up for memory loss. This patient is accompanied in the office by her  daughter Sherry Scott who supplements the history.  Previous records as well as any outside records available were reviewed prior to todays visit.  Patient is currently on donepezil 10 mg daily.   She is again accompanied by her daughter Sherry Scott who provides history today as patient is progressively more aphasic. Since her last visit, there has been continued decline. She requires assistance with all ADLs, including brushing her teeth. She can only eat finger foods and would easy messy with a spoon but feed herself. She wears Depends and has accidents sometimes. She on Depakote 250mg  1 tab every morning with resolution of myoclonus. She is also onTrazodone to 150mg  qhs which seems to have helped with sleep, but there are still times she wanders at night. reports she is having increasing difficulty articulating, but is having halluciantions, pointing and saying "it's coming out" and getting agitated. This is increasing and occurring daily. She has a mass on her right neck, unclear etiology, and continues to follow-up with her surgeon. She continues on  Buspar 10mg  TID, and  Citalopram 20mg  daily without side effects. now has 27 hours of help with patient getting on Medicaid, but needs more like 40 hours of help at home. She has expressive aphasia, answers yes and no to ROS questions, denying any headaches, dizziness, no falls.     History on Initial Assessment 06/02/2017: This is a pleasant 60 year old right-handed woman with a history of alcoholism, anxiety, depression, and dementia, presenting for a second opinion. Records from her neurologist Dr. and recent Neuropsychological evaluation were reviewed. She states her memory is "sometimes good, sometimes not, mostly not." She states "I can't...verbalize things (with hesitation)." She is noted to have significant word-finding difficulties with non-fluent speech. She states "it's hard for me to umm tell" when asked about driving and getting lost. Her daughter, Sherry Scott, provides majority of the history. 07/31/2017 started noticing cognitive changes in 2014 but brushed it off because she was heavily driving at that time. She was mean, aggressive, then would forget what she had said. She was living alone. Her friends started noticing changes in 2015 when she went to [facility] with them and was noted to repeat herself. Her daughter noticed that when they went on a trip together and she was not drinking, she was still displaying the same symptoms. She got lost in her own neighborhood of 25 years. She had difficulty with bills and had not filed her taxes or renewed her car tags. Sherry Scott called the patient's sister to come help, and together  they got her to see Dr. Vickey Huger in 2016. MMSE 21/30. Her daughter took over finances at that point. MRI brain in April 2016 showed mild diffuse and moderate perisylvian atrophy. EEG showed mild slowing. She was started on Aricept. She reportedly stopped drinking at that time and increased her nutrition, and family noticed a significant improvement where she was able to remember better, do serial 7s, draw  a clock. MMSE noted on May 2016 visit was 22/30, Aricept dose increased. Per daughter, she improved to a point where she was cleared to return to driving. Her daughter called 6 months later to report changes since last visit but did not give details. Sherry Scott reports that she got lost driving in March 2017, taking a wrong turn. She asked for help and called her daughter, who took her back for evaluation with note of significant decline. Her MMSE in July 2017 was 19/30. In October 2017, MMSE was 17/30 and [name] was added. At that point she needed someone to help her on a daily basis. Her daughter had to start managing medications because she had difficulty taking BID dosing, taking medications for different days at one time. Symptoms seemed to have leveled out until the beginning of 2018 when she had more speech difficulties. A caregiver was coming a couple of times a day. Caregiver noted that she would keep getting the same things from the grocery. She apparently was still doing her laundry and dishes. On her May 2018 visit, she was noted to have word-finding difficulties, getting stuck mid-sentence. She was ataxic with handwriting changes. MMSE was 11/30. She had a repeat MRI brain in June 2018 which did not show any acute changes, there were nonspecific periventricular and subcortical white matter hyperintensities, mild degree of generalized cerebral atrophy which is age disproportionate. 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 Family was told she should not be on her own anymore, so her daughter  bought a house and moved all of them in. Now that Sherry Scott sees her on a daily basis, she has noticed significant difficulties with simple tasks, she can't put away her clothes, turn on the TV, struggles pulling out a chair from the table and how to sit on it. Family sets out her breakfast, she can't find something if told to get it from the fridge. She cannot do laundry or dishes. Sherry Scott has to help her turn on the shower and stay close because she has developed a fear of falling. She can bathe herself but struggles with her clothes so her daughter picks them out for her.    Sherry Scott denies any paranoia or hallucinations. She was "a little on the mean side early on," but has softened up, "a little pointy sometimes." She felt appetite was not good on Namenda. She denies any headaches, dizziness, diplopia, dysarthria/dysphagia, neck/back pain, focal numbness/tingling/weakness, bowel/bladder dysfunction, anosmia. Her mother had memory issues. No history of head injuries. 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 She has had a mild tremor in both hands since December 2018. She had a fall recently, no significant injuries.  PREVIOUS MEDICATIONS:   CURRENT MEDICATIONS:  Outpatient Encounter Medications as of 03/24/2021  Medication Sig   busPIRone (BUSPAR) 10 MG tablet Take 1 tablet three times a day   citalopram (CELEXA) 20 MG tablet Take 1 tablets daily   divalproex (DEPAKOTE ER) 250 MG 24 hr tablet Take 2 tablets every morning   donepezil (ARICEPT) 10 MG tablet Take 1 tablet (10 mg total) by mouth daily.   traZODone (DESYREL) 50 MG tablet Take 150 mg by mouth at bedtime. 150 mg ( 3 tablets at bed time)   Facility-Administered Encounter Medications as of 03/24/2021  Medication   gadopentetate dimeglumine (MAGNEVIST) injection 15 mL     Objective:     PHYSICAL EXAMINATION:    VITALS:  There were no vitals filed for this  visit.  GEN:  The patient appears stated age and is in NAD. HEENT:  Normocephalic, atraumatic.   Neurological examination:  General: NAD, well-groomed, appears stated age. Orientation: The patient is alert. Oriented to person, place and date Cranial nerves: There is good facial symmetry.The speech is fluent and clear. No aphasia or dysarthria. Fund of knowledge is appropriate. Recent and remote memory are impaired. Attention and concentration are reduced.  Able to name objects and repeat phrases.  Hearing is intact to conversational tone.    Sensation: Sensation is intact to light touch throughout Motor: Strength is at least antigravity x4. Tremors: none  DTR's 2/4 in UE/LE    No flowsheet data found. MMSE - Mini Mental State Exam 07/22/2018 12/13/2017 06/09/2017  Not completed: - Unable to complete -  Orientation to time 0 - 0  Orientation to Place 0 - 1  Registration 0 - 2  Attention/ Calculation 0 - 0  Recall 1 - 0  Language- name 2 objects 1 - 1  Language- repeat 0 - 1  Language- follow 3 step command 2 - 2  Language- read & follow direction 0 - 1  Write a sentence 0 - 0  Copy design 0 - 0  Total score 4 - 8    No flowsheet data found.     Movement examination: Tone: There is normal tone in the UE/LE Abnormal movements:  no tremor.  No myoclonus.  No asterixis.   Coordination:  There is no decremation with RAM's. Normal finger to nose  Gait and Station: The patient has no difficulty arising out of a deep-seated chair without the use of the hands. The patient's stride length is good.  Gait is cautious and narrow.        Total time spent on today's visit was minutes, including both face-to-face time and nonface-to-face time. Time included that spent on review of records (prior notes available to me/labs/imaging if pertinent), discussing treatment and goals, answering patient's questions and coordinating care.  Cc:  Dalbert Mayotte, PA-C Marlowe Kays, PA-C

## 2021-03-24 ENCOUNTER — Ambulatory Visit: Payer: Medicaid Other | Admitting: Physician Assistant

## 2021-09-16 ENCOUNTER — Ambulatory Visit: Payer: Medicaid Other | Admitting: Neurology

## 2021-09-16 ENCOUNTER — Encounter: Payer: Self-pay | Admitting: Neurology

## 2021-09-25 DEATH — deceased

## 2022-10-11 IMAGING — US US SOFT TISSUE HEAD/NECK
1 series · 11 of 11 positions shown · non-contrast
Comparison: None.

CLINICAL DATA: Right supraclavicular mass

EXAM:
ULTRASOUND OF HEAD/NECK SOFT TISSUES
TECHNIQUE: Ultrasound examination of the head and neck soft tissues was
performed in the area of clinical concern.

[Series 1: us soft tissue head/neck · 11 acquisitions, 11 frames shown]
[im 1/11]
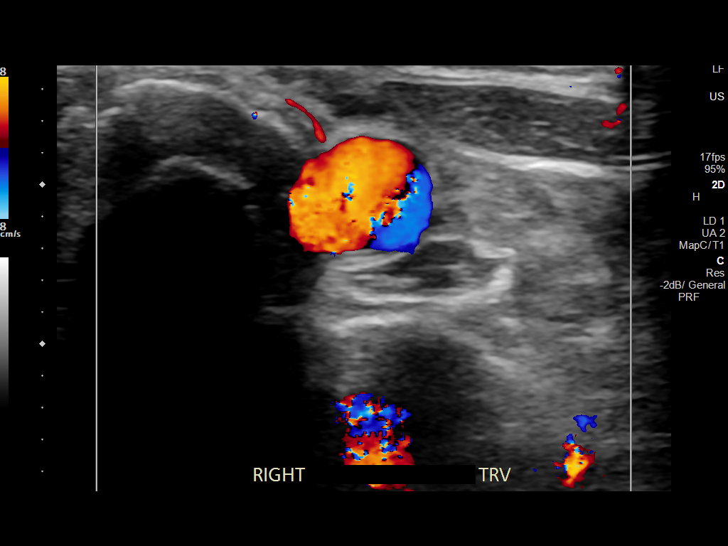
[im 2/11]
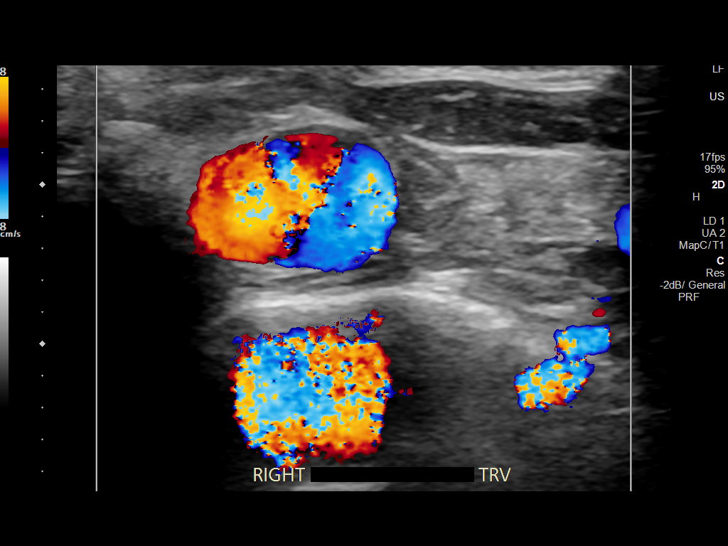
[im 3/11]
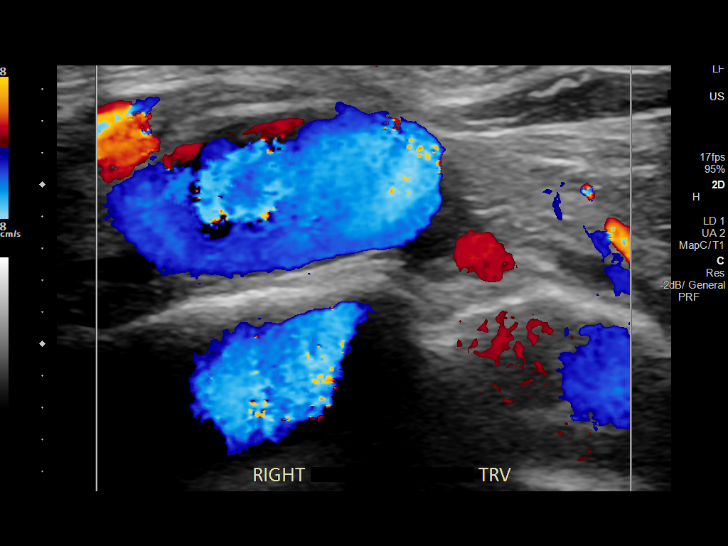
[im 4/11]
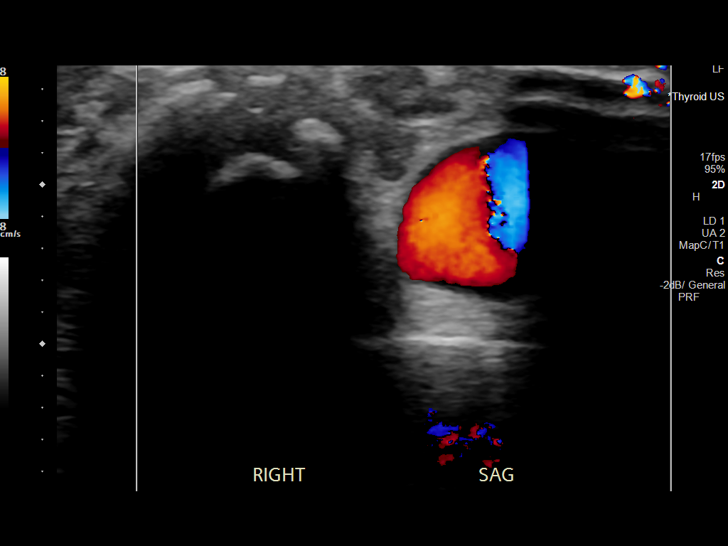
[im 5/11]
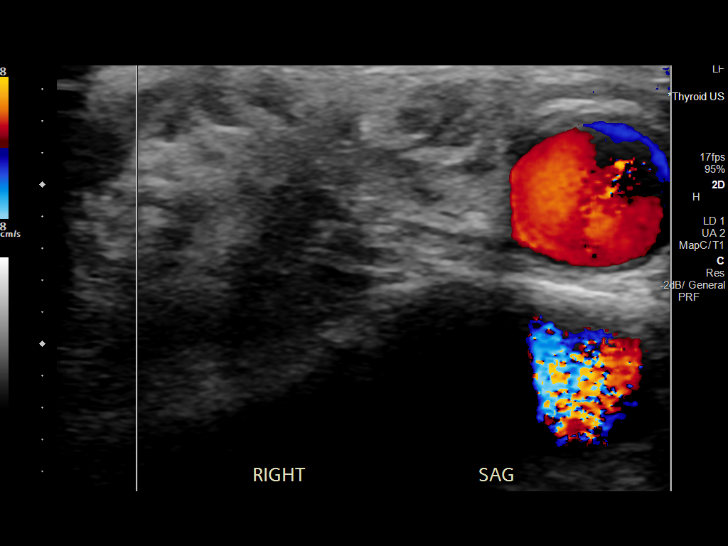
[im 6/11]
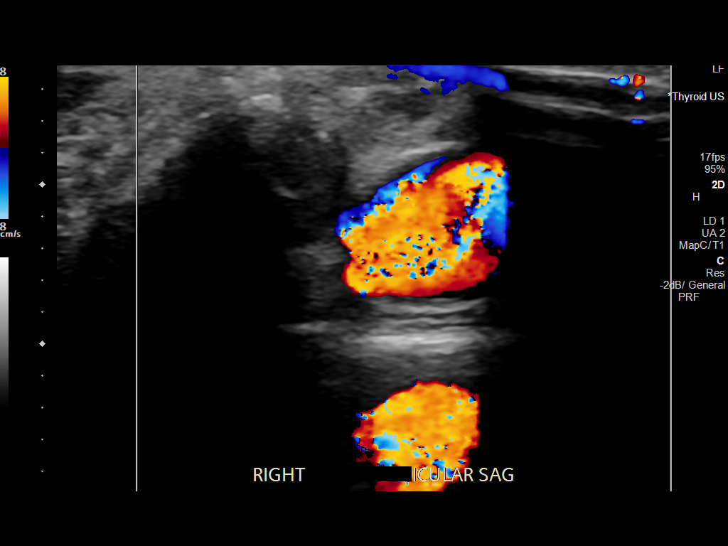
[im 7/11]
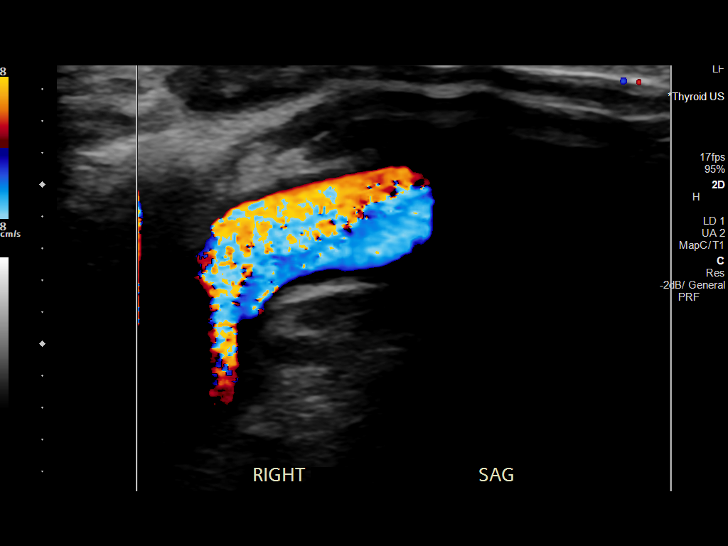
[im 8/11]
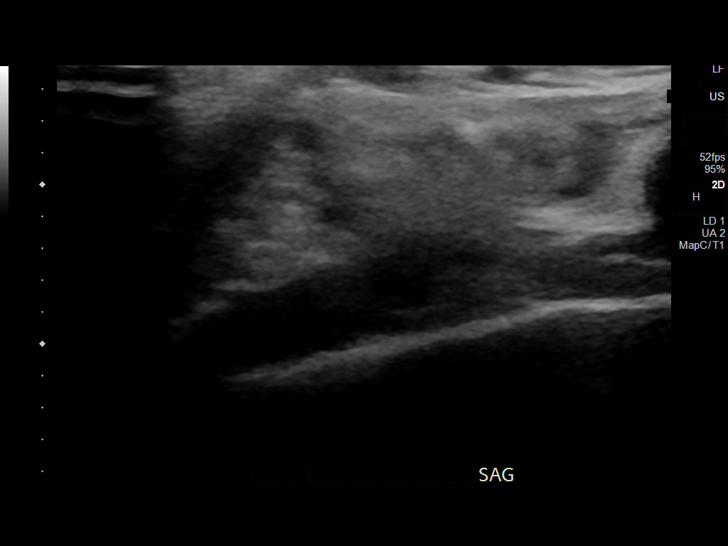
[im 9/11]
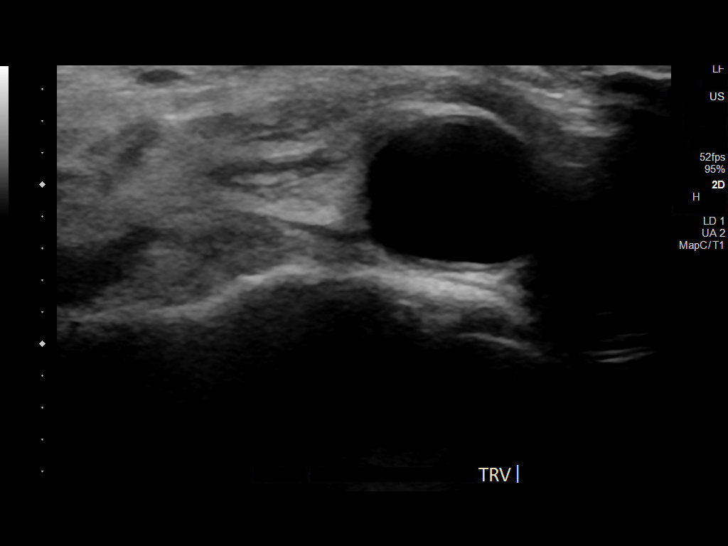
[im 10/11]
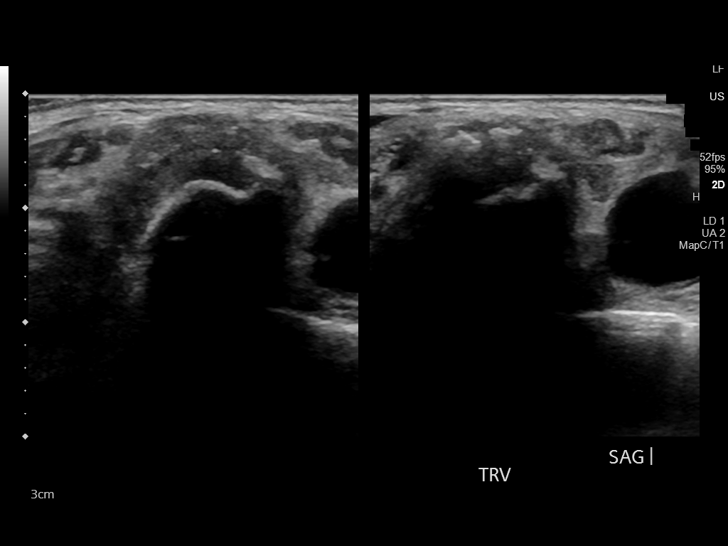
[im 11/11]
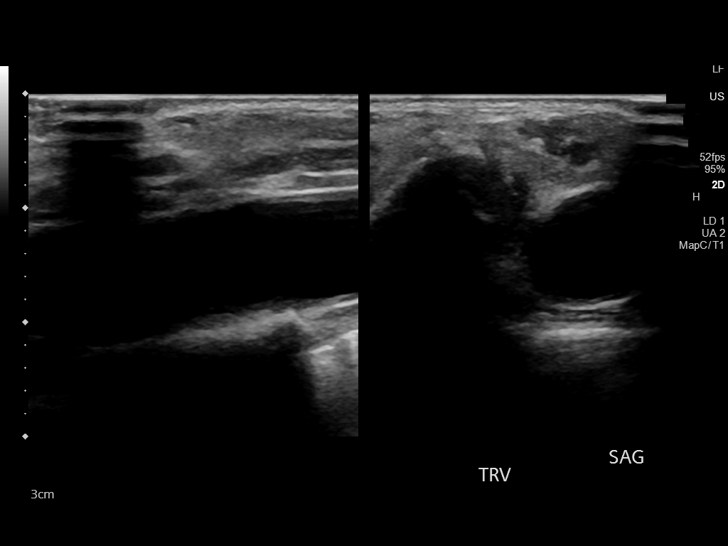

[11 of 11 positions shown; findings below may reference images not displayed]

FINDINGS: Scanning in the area of concern demonstrates no cystic or solid
mass. Normal flow in the subclavian artery and vein.
IMPRESSION: No soft tissue mass or cyst identified right supraclavicular region.
If there is further concern, consider CT neck with contrast for
further evaluation.
